# Patient Record
Sex: Female | Born: 1965 | Race: White | Hispanic: No | Marital: Married | Smoking: Former smoker
Health system: Southern US, Community
[De-identification: ages and names within clinical notes are randomized; demographics above are authoritative.]

## PROBLEM LIST (undated history)

## (undated) DIAGNOSIS — M797 Fibromyalgia: Secondary | ICD-10-CM

## (undated) DIAGNOSIS — T7840XA Allergy, unspecified, initial encounter: Secondary | ICD-10-CM

## (undated) DIAGNOSIS — F419 Anxiety disorder, unspecified: Secondary | ICD-10-CM

## (undated) DIAGNOSIS — S62102A Fracture of unspecified carpal bone, left wrist, initial encounter for closed fracture: Secondary | ICD-10-CM

## (undated) DIAGNOSIS — G43909 Migraine, unspecified, not intractable, without status migrainosus: Secondary | ICD-10-CM

## (undated) DIAGNOSIS — F32A Depression, unspecified: Secondary | ICD-10-CM

## (undated) DIAGNOSIS — F329 Major depressive disorder, single episode, unspecified: Secondary | ICD-10-CM

## (undated) HISTORY — DX: Depression, unspecified: F32.A

## (undated) HISTORY — DX: Migraine, unspecified, not intractable, without status migrainosus: G43.909

## (undated) HISTORY — DX: Fibromyalgia: M79.7

## (undated) HISTORY — DX: Major depressive disorder, single episode, unspecified: F32.9

## (undated) HISTORY — DX: Anxiety disorder, unspecified: F41.9

## (undated) HISTORY — DX: Fracture of unspecified carpal bone, left wrist, initial encounter for closed fracture: S62.102A

## (undated) HISTORY — DX: Allergy, unspecified, initial encounter: T78.40XA

## (undated) HISTORY — PX: APPENDECTOMY: SHX54

---

## 2002-03-15 HISTORY — PX: LEFT OOPHORECTOMY: SHX1961

## 2009-02-09 ENCOUNTER — Ambulatory Visit: Payer: Self-pay | Admitting: Internal Medicine

## 2009-02-12 ENCOUNTER — Ambulatory Visit: Payer: Self-pay | Admitting: Internal Medicine

## 2009-04-14 ENCOUNTER — Ambulatory Visit: Payer: Self-pay | Admitting: Internal Medicine

## 2009-05-12 ENCOUNTER — Ambulatory Visit: Payer: Self-pay | Admitting: Family Medicine

## 2009-11-14 ENCOUNTER — Ambulatory Visit: Payer: Self-pay | Admitting: Family Medicine

## 2010-03-19 ENCOUNTER — Ambulatory Visit: Payer: Self-pay | Admitting: Family Medicine

## 2012-12-06 ENCOUNTER — Ambulatory Visit: Payer: Self-pay | Admitting: Family Medicine

## 2012-12-06 LAB — HM MAMMOGRAPHY

## 2013-05-24 ENCOUNTER — Emergency Department: Payer: Self-pay | Admitting: Emergency Medicine

## 2013-05-24 LAB — URINALYSIS, COMPLETE
Bacteria: NONE SEEN
Bilirubin,UR: NEGATIVE
Glucose,UR: NEGATIVE mg/dL (ref 0–75)
Ketone: NEGATIVE
Leukocyte Esterase: NEGATIVE
Nitrite: NEGATIVE
PROTEIN: NEGATIVE
Ph: 6 (ref 4.5–8.0)
RBC,UR: 3 /HPF (ref 0–5)
Specific Gravity: 1.005 (ref 1.003–1.030)
WBC UR: <1 /HPF (ref 0–5)

## 2013-05-24 LAB — COMPREHENSIVE METABOLIC PANEL
ALK PHOS: 69 U/L
ANION GAP: 4 — AB (ref 7–16)
Albumin: 4.1 g/dL (ref 3.4–5.0)
BUN: 13 mg/dL (ref 7–18)
Bilirubin,Total: 0.3 mg/dL (ref 0.2–1.0)
CALCIUM: 9.2 mg/dL (ref 8.5–10.1)
Chloride: 106 mmol/L (ref 98–107)
Co2: 27 mmol/L (ref 21–32)
Creatinine: 0.83 mg/dL (ref 0.60–1.30)
EGFR (African American): >60
EGFR (Non-African Amer.): >60
GLUCOSE: 103 mg/dL — AB (ref 65–99)
Osmolality: 274 (ref 275–301)
Potassium: 3.8 mmol/L (ref 3.5–5.1)
SGOT(AST): 33 U/L (ref 15–37)
SGPT (ALT): 24 U/L (ref 12–78)
Sodium: 137 mmol/L (ref 136–145)
Total Protein: 7.8 g/dL (ref 6.4–8.2)

## 2013-05-24 LAB — CBC
HCT: 38 % (ref 35.0–47.0)
HGB: 12.5 g/dL (ref 12.0–16.0)
MCH: 30.3 pg (ref 26.0–34.0)
MCHC: 32.8 g/dL (ref 32.0–36.0)
MCV: 92 fL (ref 80–100)
PLATELETS: 277 10*3/uL (ref 150–440)
RBC: 4.11 10*6/uL (ref 3.80–5.20)
RDW: 14.2 % (ref 11.5–14.5)
WBC: 7.7 10*3/uL (ref 3.6–11.0)

## 2013-05-24 LAB — LIPASE, BLOOD: Lipase: 95 U/L (ref 73–393)

## 2013-07-02 ENCOUNTER — Ambulatory Visit: Payer: Self-pay | Admitting: Physician Assistant

## 2014-08-19 ENCOUNTER — Other Ambulatory Visit: Payer: Self-pay | Admitting: Unknown Physician Specialty

## 2014-08-19 MED ORDER — OXYCODONE-ACETAMINOPHEN 5-325 MG PO TABS: 1.0000 | ORAL_TABLET | Freq: Three times a day (TID) | ORAL | Status: DC | PRN

## 2014-09-06 ENCOUNTER — Other Ambulatory Visit: Payer: Self-pay | Admitting: Unknown Physician Specialty

## 2014-09-06 MED ORDER — OXYCODONE-ACETAMINOPHEN 5-325 MG PO TABS: 1.0000 | ORAL_TABLET | Freq: Three times a day (TID) | ORAL | Status: DC | PRN

## 2014-09-11 ENCOUNTER — Ambulatory Visit: Payer: Self-pay | Admitting: Unknown Physician Specialty

## 2014-09-13 ENCOUNTER — Encounter: Payer: Self-pay | Admitting: Unknown Physician Specialty

## 2014-09-13 ENCOUNTER — Ambulatory Visit (INDEPENDENT_AMBULATORY_CARE_PROVIDER_SITE_OTHER): Payer: Self-pay | Admitting: Unknown Physician Specialty

## 2014-09-13 VITALS — BP 112/62 | HR 66 | Temp 98.1°F | Ht 61.0 in | Wt 91.2 lb

## 2014-09-13 DIAGNOSIS — G8929 Other chronic pain: Secondary | ICD-10-CM

## 2014-09-13 DIAGNOSIS — G4700 Insomnia, unspecified: Secondary | ICD-10-CM

## 2014-09-13 MED ORDER — CLONAZEPAM 1 MG PO TABS
1.0000 mg | ORAL_TABLET | Freq: Every day | ORAL | Status: DC
Start: 2014-09-13 — End: 2014-10-11

## 2014-09-13 NOTE — Assessment & Plan Note (Signed)
Continue with Clonazepam at night

## 2014-09-13 NOTE — Assessment & Plan Note (Addendum)
Stable.  Continue present dose of medications.  She has been on the same dose for 17 years.

## 2014-09-13 NOTE — Progress Notes (Signed)
BP 112/62 mmHg  Pulse 66  Temp(Src) 98.1 F (36.7 C)  Ht  (1.549 m)  Wt 91 lb 3.2 oz (41.368 kg)  BMI 17.24 kg/m2  SpO2 99%  LMP 07/28/2014 (Approximate)   Subjective:    Patient ID: Annette Atkins, female    DOB: March 02, 1966, 49 y.o.   MRN: [id number]  HPI: Annette Atkins is a 49 y.o. female  Chief Complaint  Patient presents with  . Pain   Working with nutritional therapy.  Pt started juicing with raw vegetables and proteins.  Feels it balanced her body out and hot flashes are gone.  Night sweats are gone and sleeps better.  Son has ODD and due to behavioral issues with her son and has a lot of anxiety and panic "and I don't know how to get rid of the Klonopin."  She has been able to stop the Seroquel.      1.  CHRONIC PAIN    Pain not doing well between her shoulder and her back. But "it is tolerable."  She is able to accomplish household tasks as long as not to physical.  She has been on a stable amount of medications for several years now.    Patient requesting refill of pain medication.  Pain control status:  stable   H6 Pt with chronic pain since Novemeber of 2001 when she delivered her youngest child.  She describes low back pain "dead in the center and radiates to the right.  Takes Percocet BID and sometimes TID for managment.  She has tried hot tub, massagem therapy, "air chair," Tens unit,   We have tried adding Cymbalta, Lyrica, and Neurontin.  They make her sick with nausea, headaches and dizzy.   Benefit from narcotic medications:  yes  H6   Interested in weaning off narcotics:  yes  H6   Stool softners/OTC fiber:  no H6   Previous pain specialty evaluation:  yes  H6 Narcotic contract:  yes  H6     Relevant past medical, surgical, family and social history reviewed and updated as indicated. Interim medical history since our last visit reviewed. Allergies and medications reviewed and updated.  Review of Systems  Per HPI unless specifically  indicated above     Objective:    BP 112/62 mmHg  Pulse 66  Temp(Src) 98.1 F (36.7 C)  Ht  (1.549 m)  Wt 91 lb 3.2 oz (41.368 kg)  BMI 17.24 kg/m2  SpO2 99%  LMP 07/28/2014 (Approximate)  Wt Readings from Last 3 Encounters:  09/13/14 91 lb 3.2 oz (41.368 kg)  05/29/14 99 lb (44.906 kg)    Physical Exam  Constitutional: She is oriented to person, place, and time. She appears well-developed and well-nourished. No distress.  HENT:  Head: Normocephalic and atraumatic.  Eyes: Conjunctivae and lids are normal. Right eye exhibits no discharge. Left eye exhibits no discharge. No scleral icterus.  Cardiovascular: Normal rate and regular rhythm.   Pulmonary/Chest: Effort normal. No respiratory distress.  Abdominal: Normal appearance and bowel sounds are normal. She exhibits no distension. There is no splenomegaly or hepatomegaly. There is no tenderness.  Musculoskeletal: Normal range of motion.  Neurological: She is alert and oriented to person, place, and time.  Skin: Skin is intact. No rash noted. No pallor.  Psychiatric: She has a normal mood and affect. Her behavior is normal. Judgment and thought content normal.  Nursing note and vitals reviewed.      Assessment & Plan:  Problem List Items Addressed This Visit      Other   Insomnia - Primary    Continue with Clonazepam at night      Chronic pain    Stable.  Continue present dose of medications.  She has been on the same dose for 17 years.        Relevant Medications   clonazePAM (KLONOPIN) 1 MG tablet       Follow up plan: Return in about 3 months (around 12/14/2014).

## 2014-10-11 ENCOUNTER — Other Ambulatory Visit: Payer: Self-pay | Admitting: Unknown Physician Specialty

## 2014-10-11 MED ORDER — CLONAZEPAM 1 MG PO TABS: 1.0000 mg | ORAL_TABLET | Freq: Every day | ORAL | Status: DC

## 2014-10-11 MED ORDER — OXYCODONE-ACETAMINOPHEN 5-325 MG PO TABS: 1.0000 | ORAL_TABLET | Freq: Three times a day (TID) | ORAL | Status: DC | PRN

## 2014-10-14 ENCOUNTER — Other Ambulatory Visit: Payer: Self-pay | Admitting: Unknown Physician Specialty

## 2014-11-06 ENCOUNTER — Telehealth: Payer: Self-pay | Admitting: Unknown Physician Specialty

## 2014-11-06 MED ORDER — SUMATRIPTAN SUCCINATE 100 MG PO TABS: 100.0000 mg | ORAL_TABLET | ORAL | Status: DC | PRN

## 2014-11-06 NOTE — Telephone Encounter (Signed)
Routing to provider. Patient was last seen on 09/13/14. Looking in practice partner, patient was given imitrex back in 2012. Pharmacy is [facility].

## 2014-11-06 NOTE — Telephone Encounter (Signed)
Pt stated she has a migraine wants to know if an RX for Imitrex can be called into the pharmacy for her. Pt stated the current migraine medication she has is too expensive. Pharm is [facility]. Pt has severe migraine and wants to know if this can be sent today, pt has to leave to go out of town tomorrow and has spent the last few days in bed. Thanks.

## 2014-11-06 NOTE — Telephone Encounter (Signed)
Called and let patient know rx was sent to pharmacy. 

## 2014-11-06 NOTE — Telephone Encounter (Signed)
Rx sent to her pharmacy 

## 2014-11-07 ENCOUNTER — Telehealth: Payer: Self-pay

## 2014-11-07 NOTE — Telephone Encounter (Signed)
Patient came in the office yesterday right before our computers went down and asked for me to send a message to [facility] to see if she could be put back on phenergan. Stated she is having trouble with BM's. Looking at practice partner, patient had this medication back in 2014. Practice partner number is [phone] and pharmacy is [place].

## 2014-11-08 ENCOUNTER — Other Ambulatory Visit: Payer: Self-pay | Admitting: Unknown Physician Specialty

## 2014-11-08 MED ORDER — OXYCODONE-ACETAMINOPHEN 5-325 MG PO TABS: 1.0000 | ORAL_TABLET | Freq: Three times a day (TID) | ORAL | Status: DC | PRN

## 2014-11-08 MED ORDER — PROMETHAZINE HCL 25 MG PO TABS: 25.0000 mg | ORAL_TABLET | Freq: Three times a day (TID) | ORAL | Status: DC | PRN

## 2014-11-08 NOTE — Telephone Encounter (Signed)
I don't quite understand.  Phenergan is used for nausea not BMs.  Is she having nausea secondary to constipation?  If so, we should treat that.

## 2014-11-08 NOTE — Telephone Encounter (Signed)
Called and spoke to patient. I confused her with another patient about the constipation, so this patient does not have constipation. She stated she is getting nauseated when she gets a migraine.

## 2014-11-08 NOTE — Telephone Encounter (Signed)
Called and left patient a voicemail to please call me back so we can figure out exactly why she is needing this medication.

## 2014-11-11 NOTE — Telephone Encounter (Signed)
Called and let patient know rx was sent to her pharmacy.

## 2014-11-13 ENCOUNTER — Other Ambulatory Visit: Payer: Self-pay | Admitting: Unknown Physician Specialty

## 2014-11-13 MED ORDER — CLONAZEPAM 1 MG PO TABS: 1.0000 mg | ORAL_TABLET | Freq: Every day | ORAL | Status: DC

## 2014-12-06 ENCOUNTER — Other Ambulatory Visit: Payer: Self-pay | Admitting: Unknown Physician Specialty

## 2014-12-06 MED ORDER — OXYCODONE-ACETAMINOPHEN 5-325 MG PO TABS: 1.0000 | ORAL_TABLET | Freq: Three times a day (TID) | ORAL | Status: DC | PRN

## 2014-12-06 MED ORDER — CLONAZEPAM 1 MG PO TABS: 1.0000 mg | ORAL_TABLET | Freq: Every day | ORAL | Status: DC

## 2014-12-25 ENCOUNTER — Other Ambulatory Visit: Payer: Self-pay | Admitting: Unknown Physician Specialty

## 2014-12-25 MED ORDER — OXYCODONE-ACETAMINOPHEN 5-325 MG PO TABS: 1.0000 | ORAL_TABLET | Freq: Three times a day (TID) | ORAL | Status: DC | PRN

## 2014-12-25 MED ORDER — CLONAZEPAM 1 MG PO TABS: 1.0000 mg | ORAL_TABLET | Freq: Every day | ORAL | Status: DC

## 2015-01-31 ENCOUNTER — Other Ambulatory Visit: Payer: Self-pay | Admitting: Unknown Physician Specialty

## 2015-01-31 MED ORDER — CLONAZEPAM 1 MG PO TABS: 1.0000 mg | ORAL_TABLET | Freq: Every day | ORAL | Status: DC

## 2015-01-31 MED ORDER — OXYCODONE-ACETAMINOPHEN 5-325 MG PO TABS: 1.0000 | ORAL_TABLET | Freq: Three times a day (TID) | ORAL | Status: DC | PRN

## 2015-02-24 ENCOUNTER — Other Ambulatory Visit: Payer: Self-pay | Admitting: Unknown Physician Specialty

## 2015-02-24 DIAGNOSIS — Z79891 Long term (current) use of opiate analgesic: Secondary | ICD-10-CM

## 2015-02-24 MED ORDER — CLONAZEPAM 1 MG PO TABS: 1.0000 mg | ORAL_TABLET | Freq: Every day | ORAL | Status: DC

## 2015-02-24 MED ORDER — OXYCODONE-ACETAMINOPHEN 5-325 MG PO TABS: 1.0000 | ORAL_TABLET | Freq: Three times a day (TID) | ORAL | Status: DC | PRN

## 2015-03-04 ENCOUNTER — Other Ambulatory Visit: Payer: Self-pay

## 2015-03-04 DIAGNOSIS — Z79891 Long term (current) use of opiate analgesic: Secondary | ICD-10-CM

## 2015-03-14 LAB — URINE DRUGS OF ABUSE SCREEN W ALC, ROUTINE (REF LAB)
AMPHETAMINES, URINE: NEGATIVE ng/mL
BARBITURATE QUANT UR: NEGATIVE ng/mL
BENZODIAZEPINE QUANT UR: NEGATIVE ng/mL
Cocaine (Metab.): NEGATIVE ng/mL
ETHANOL U, QUAN: NEGATIVE %
Methadone Screen, Urine: NEGATIVE ng/mL
Opiate Quant, Ur: NEGATIVE ng/mL
PCP Quant, Ur: NEGATIVE ng/mL
Propoxyphene: NEGATIVE ng/mL

## 2015-03-14 LAB — PANEL 799049
CANNABINOID GC/MS UR: POSITIVE — AB
CARBOXY THC GC/MS CONF: 153 ng/mL

## 2015-03-28 ENCOUNTER — Other Ambulatory Visit: Payer: Self-pay | Admitting: Unknown Physician Specialty

## 2015-03-28 MED ORDER — OXYCODONE-ACETAMINOPHEN 5-325 MG PO TABS: 1.0000 | ORAL_TABLET | Freq: Three times a day (TID) | ORAL | Status: DC | PRN

## 2015-03-28 MED ORDER — CLONAZEPAM 1 MG PO TABS: 1.0000 mg | ORAL_TABLET | Freq: Every day | ORAL | Status: DC

## 2015-04-25 ENCOUNTER — Other Ambulatory Visit: Payer: Self-pay | Admitting: Unknown Physician Specialty

## 2015-04-25 MED ORDER — CLONAZEPAM 1 MG PO TABS: 1.0000 mg | ORAL_TABLET | Freq: Every day | ORAL | Status: DC

## 2015-04-25 MED ORDER — OXYCODONE-ACETAMINOPHEN 5-325 MG PO TABS: 1.0000 | ORAL_TABLET | Freq: Three times a day (TID) | ORAL | Status: DC | PRN

## 2015-04-30 ENCOUNTER — Other Ambulatory Visit: Payer: Self-pay | Admitting: Family Medicine

## 2015-04-30 ENCOUNTER — Other Ambulatory Visit: Payer: Self-pay | Admitting: Unknown Physician Specialty

## 2015-04-30 NOTE — Telephone Encounter (Signed)
Your patient 

## 2015-05-07 ENCOUNTER — Other Ambulatory Visit: Payer: Self-pay | Admitting: Unknown Physician Specialty

## 2015-05-07 MED ORDER — CLONAZEPAM 1 MG PO TABS: 1.0000 mg | ORAL_TABLET | Freq: Every day | ORAL | Status: DC

## 2015-05-07 MED ORDER — OXYCODONE-ACETAMINOPHEN 5-325 MG PO TABS: 1.0000 | ORAL_TABLET | Freq: Three times a day (TID) | ORAL | Status: DC | PRN

## 2015-05-21 ENCOUNTER — Telehealth: Payer: Self-pay | Admitting: Unknown Physician Specialty

## 2015-05-21 ENCOUNTER — Ambulatory Visit: Payer: Self-pay | Admitting: Unknown Physician Specialty

## 2015-05-21 NOTE — Telephone Encounter (Signed)
Pt called stated she needs [place] suppositories called in for her as she can not take the pills. Pt stated she vomits and the pills come back up. Please call in suppositories ASAP. Pt has not slept. [facility]. Thanks.

## 2015-05-21 NOTE — Telephone Encounter (Signed)
Pt called to reschedule, stating she was in a lot of pain and had been up all night vomiting. Thanks.

## 2015-05-21 NOTE — Telephone Encounter (Signed)
Routing to provider. Patient had appointment today but moved it to 06/02/15.

## 2015-05-29 ENCOUNTER — Other Ambulatory Visit: Payer: Self-pay | Admitting: Family Medicine

## 2015-05-29 MED ORDER — PROMETHAZINE HCL 25 MG RE SUPP: 25.0000 mg | Freq: Four times a day (QID) | RECTAL | Status: DC | PRN

## 2015-06-02 ENCOUNTER — Ambulatory Visit (INDEPENDENT_AMBULATORY_CARE_PROVIDER_SITE_OTHER): Payer: Self-pay | Admitting: Unknown Physician Specialty

## 2015-06-02 ENCOUNTER — Encounter: Payer: Self-pay | Admitting: Unknown Physician Specialty

## 2015-06-02 VITALS — BP 106/67 | HR 80 | Temp 98.0°F | Ht 60.0 in | Wt 93.4 lb

## 2015-06-02 DIAGNOSIS — K900 Celiac disease: Secondary | ICD-10-CM | POA: Insufficient documentation

## 2015-06-02 DIAGNOSIS — G4700 Insomnia, unspecified: Secondary | ICD-10-CM

## 2015-06-02 DIAGNOSIS — G43909 Migraine, unspecified, not intractable, without status migrainosus: Secondary | ICD-10-CM | POA: Insufficient documentation

## 2015-06-02 DIAGNOSIS — G8929 Other chronic pain: Secondary | ICD-10-CM

## 2015-06-02 DIAGNOSIS — G43009 Migraine without aura, not intractable, without status migrainosus: Secondary | ICD-10-CM

## 2015-06-02 DIAGNOSIS — F410 Panic disorder [episodic paroxysmal anxiety] without agoraphobia: Secondary | ICD-10-CM

## 2015-06-02 NOTE — Progress Notes (Signed)
++  BP 106/67 mmHg  Pulse 80  Temp(Src) 98 F (36.7 C)  Ht 5' (1.524 m)  Wt 93 lb 6.4 oz (42.366 kg)  BMI 18.24 kg/m2  SpO2 96%  LMP  (LMP Unknown)   Subjective:    Patient ID: Annette Atkins, female    DOB: 08/30/1965, 50 y.o.   MRN: [id number]  HPI: Annette Atkins is a 50 y.o. female  Chief Complaint  Patient presents with  . Depression   Pt states she is doing well. With current medications and supplements.    Pain management Taking oxycodone BID for about 16 years.  She takes a drug holiday for one week twice a year.  She states if she did not have the pain medications she would not be able to do anything around the house and "would not have a life."  Without it she would need someone to dress her.  Discussed positive drug screen for marijuana.  States she takes a small amount steamed in the PM.  She also takes the Clonazepam nightly.  States it keeps her panic and her anxiety attack "at bay."    Celiac Strict diet adherence.  No B12 levels for a period of time.  Takes B12 and Vitamin D along with other supplements.    Migraines "It has been a bad 2 weeks.  I lost my aunt."  States she finds that traveling on her own is difficult.    Depression screen PHQ 2/9 06/02/2015  Decreased Interest 0  Down, Depressed, Hopeless 0  PHQ - 2 Score 0      Relevant past medical, surgical, family and social history reviewed and updated as indicated. Interim medical history since our last visit reviewed. Allergies and medications reviewed and updated.  Review of Systems  Musculoskeletal:       Left middle finger drop due to cut by a tin can that she never got repaired.      Per HPI unless specifically indicated above     Objective:    BP 106/67 mmHg  Pulse 80  Temp(Src) 98 F (36.7 C)  Ht 5' (1.524 m)  Wt 93 lb 6.4 oz (42.366 kg)  BMI 18.24 kg/m2  SpO2 96%  LMP  (LMP Unknown)  Wt Readings from Last 3 Encounters:  06/02/15 93 lb 6.4 oz (42.366 kg)   09/13/14 91 lb 3.2 oz (41.368 kg)  05/29/14 99 lb (44.906 kg)    Physical Exam  Constitutional: She is oriented to person, place, and time. She appears well-developed and well-nourished. No distress.  HENT:  Head: Normocephalic and atraumatic.  Eyes: Conjunctivae and lids are normal. Right eye exhibits no discharge. Left eye exhibits no discharge. No scleral icterus.  Neck: Normal range of motion. Neck supple. No JVD present. Carotid bruit is not present.  Cardiovascular: Normal rate, regular rhythm and normal heart sounds.   Pulmonary/Chest: Effort normal and breath sounds normal.  Abdominal: Normal appearance. There is no splenomegaly or hepatomegaly.  Musculoskeletal: Normal range of motion.  Neurological: She is alert and oriented to person, place, and time.  Skin: Skin is warm, dry and intact. No rash noted. No pallor.  Psychiatric: She has a normal mood and affect. Her behavior is normal. Judgment and thought content normal.    Results for orders placed or performed in visit on 05/21/15  HM MAMMOGRAPHY  Result Value Ref Range   HM Mammogram from PP       Assessment & Plan:   Problem List Items  Addressed This Visit      Unprioritized   Insomnia   Chronic pain   Panic - Primary   Migraine   Relevant Orders   Comprehensive metabolic panel   CBC   Vitamin B12   Celiac disease   Relevant Orders   Comprehensive metabolic panel   CBC   Vitamin B12      Diagnosis are stable.  There is a concern about poly pharmacy but she has been on a stable doses.  Labs of CBC, CMP B12 when she gets insurance.    Follow up plan: Return in about 6 months (around 12/03/2015).

## 2015-06-20 ENCOUNTER — Other Ambulatory Visit: Payer: Self-pay | Admitting: Unknown Physician Specialty

## 2015-06-20 MED ORDER — OXYCODONE-ACETAMINOPHEN 5-325 MG PO TABS: 1.0000 | ORAL_TABLET | Freq: Three times a day (TID) | ORAL | Status: DC | PRN

## 2015-06-23 ENCOUNTER — Other Ambulatory Visit: Payer: Self-pay | Admitting: Family Medicine

## 2015-06-23 MED ORDER — CLONAZEPAM 1 MG PO TABS: 1.0000 mg | ORAL_TABLET | Freq: Every day | ORAL | Status: DC

## 2015-06-25 ENCOUNTER — Ambulatory Visit: Payer: Self-pay | Admitting: Family Medicine

## 2015-06-26 ENCOUNTER — Ambulatory Visit: Payer: Self-pay | Admitting: Family Medicine

## 2015-07-15 ENCOUNTER — Other Ambulatory Visit: Payer: Self-pay | Admitting: Unknown Physician Specialty

## 2015-07-15 MED ORDER — CLONAZEPAM 1 MG PO TABS: 1.0000 mg | ORAL_TABLET | Freq: Every day | ORAL | Status: DC

## 2015-07-15 MED ORDER — OXYCODONE-ACETAMINOPHEN 5-325 MG PO TABS: 1.0000 | ORAL_TABLET | Freq: Three times a day (TID) | ORAL | Status: DC | PRN

## 2015-08-12 ENCOUNTER — Other Ambulatory Visit: Payer: Self-pay | Admitting: Unknown Physician Specialty

## 2015-08-12 MED ORDER — OXYCODONE-ACETAMINOPHEN 5-325 MG PO TABS: 1.0000 | ORAL_TABLET | Freq: Three times a day (TID) | ORAL | Status: DC | PRN

## 2015-08-12 MED ORDER — CLONAZEPAM 1 MG PO TABS: 1.0000 mg | ORAL_TABLET | Freq: Every day | ORAL | Status: DC

## 2015-09-04 ENCOUNTER — Telehealth: Payer: Self-pay | Admitting: Family Medicine

## 2015-09-04 MED ORDER — QUETIAPINE FUMARATE 50 MG PO TABS: 50.0000 mg | ORAL_TABLET | Freq: Every day | ORAL | Status: DC

## 2015-09-04 NOTE — Telephone Encounter (Signed)
Pt called, convulsively sobbing, nephew was killed in a head on collusion days after his graduation.  She has not slept for several days, has already taken klonopin which helps her be less anxious but does not help her sleep.  Can something be called in?  Would also like Elnita MaxwellCheryl to call her tomorrow [birth year], (also pt's birthday).

## 2015-09-04 NOTE — Telephone Encounter (Signed)
Already on klonopin, no better with 2 pills. Has been on seroquel in the past. Will refill her seroquel for sleep. Cheryl, she'd like it if you could please give her a call tomorrow. Thanks!

## 2015-09-05 ENCOUNTER — Other Ambulatory Visit: Payer: Self-pay | Admitting: Unknown Physician Specialty

## 2015-09-05 ENCOUNTER — Other Ambulatory Visit: Payer: Self-pay

## 2015-09-05 MED ORDER — OXYCODONE-ACETAMINOPHEN 5-325 MG PO TABS: 1.0000 | ORAL_TABLET | Freq: Three times a day (TID) | ORAL | Status: DC | PRN

## 2015-09-05 MED ORDER — SUMATRIPTAN SUCCINATE 100 MG PO TABS: 100.0000 mg | ORAL_TABLET | Freq: Once | ORAL | Status: DC

## 2015-09-05 MED ORDER — CLONAZEPAM 1 MG PO TABS: 1.0000 mg | ORAL_TABLET | Freq: Every day | ORAL | Status: DC

## 2015-09-05 NOTE — Telephone Encounter (Signed)
Called and left a message.

## 2015-10-08 ENCOUNTER — Other Ambulatory Visit: Payer: Self-pay | Admitting: Unknown Physician Specialty

## 2015-10-08 MED ORDER — OXYCODONE-ACETAMINOPHEN 5-325 MG PO TABS: 1.0000 | ORAL_TABLET | Freq: Three times a day (TID) | ORAL | 0 refills | Status: DC | PRN

## 2015-10-08 MED ORDER — CLONAZEPAM 1 MG PO TABS: 1.0000 mg | ORAL_TABLET | Freq: Every day | ORAL | 0 refills | Status: DC

## 2015-10-17 ENCOUNTER — Other Ambulatory Visit: Payer: Self-pay | Admitting: Unknown Physician Specialty

## 2015-10-17 MED ORDER — OXYCODONE-ACETAMINOPHEN 5-325 MG PO TABS: 1.0000 | ORAL_TABLET | Freq: Three times a day (TID) | ORAL | 0 refills | Status: DC | PRN

## 2015-10-17 MED ORDER — CLONAZEPAM 1 MG PO TABS: 1.0000 mg | ORAL_TABLET | Freq: Every day | ORAL | 0 refills | Status: DC

## 2015-11-07 ENCOUNTER — Other Ambulatory Visit: Payer: Self-pay | Admitting: Unknown Physician Specialty

## 2015-11-07 NOTE — Telephone Encounter (Signed)
Your patient 

## 2015-11-27 ENCOUNTER — Other Ambulatory Visit: Payer: Self-pay | Admitting: Unknown Physician Specialty

## 2015-11-27 MED ORDER — CLONAZEPAM 1 MG PO TABS: 1.0000 mg | ORAL_TABLET | Freq: Every day | ORAL | 0 refills | Status: DC

## 2015-11-27 MED ORDER — OXYCODONE-ACETAMINOPHEN 5-325 MG PO TABS: 1.0000 | ORAL_TABLET | Freq: Three times a day (TID) | ORAL | 0 refills | Status: DC | PRN

## 2015-12-08 ENCOUNTER — Ambulatory Visit: Payer: Self-pay | Admitting: Unknown Physician Specialty

## 2015-12-19 ENCOUNTER — Encounter: Payer: Self-pay | Admitting: Unknown Physician Specialty

## 2015-12-19 ENCOUNTER — Ambulatory Visit (INDEPENDENT_AMBULATORY_CARE_PROVIDER_SITE_OTHER): Payer: Self-pay | Admitting: Unknown Physician Specialty

## 2015-12-19 DIAGNOSIS — K900 Celiac disease: Secondary | ICD-10-CM

## 2015-12-19 DIAGNOSIS — G894 Chronic pain syndrome: Secondary | ICD-10-CM

## 2015-12-19 MED ORDER — QUETIAPINE FUMARATE 50 MG PO TABS: 50.0000 mg | ORAL_TABLET | Freq: Every day | ORAL | 6 refills | Status: DC

## 2015-12-19 NOTE — Assessment & Plan Note (Signed)
Stable, continue present medications.   

## 2015-12-19 NOTE — Progress Notes (Signed)
   BP 118/75 (BP Location: Left Arm, Patient Position: Sitting, Cuff Size: Small)   Pulse 80   Temp 98.4 F (36.9 C)   Wt 91 lb 12.8 oz (41.6 kg)   SpO2 96%   BMI 17.93 kg/m    Subjective:    Patient ID: Annette Atkins, female    DOB: 02-03-1966, 50 y.o.   MRN: [id number]  HPI: Annette Atkins is a 50 y.o. female  Chief Complaint  Patient presents with  . Anxiety  . Insomnia   Pt states she is just here for her "6 month check-up".  She is having a great deal of problems with multiple family tragedies.   Pain management Taking oxycodone BID for about 16 years.  She takes a drug holiday for one week twice a year.  She states if she did not have the pain medications she would not be able to do anything around the house and "would not have a life."  Without it she would need someone to dress her.  Discussed positive drug screen for marijuana.  States she takes a small amount steamed in the PM.  She also takes the Clonazepam nightly.  States it keeps her panic and her anxiety attack "at bay."    Celiac Strict diet adherence.   Relevant past medical, surgical, family and social history reviewed and updated as indicated. Interim medical history since our last visit reviewed. Allergies and medications reviewed and updated.  Review of Systems  Per HPI unless specifically indicated above     Objective:    BP 118/75 (BP Location: Left Arm, Patient Position: Sitting, Cuff Size: Small)   Pulse 80   Temp 98.4 F (36.9 C)   Wt 91 lb 12.8 oz (41.6 kg)   SpO2 96%   BMI 17.93 kg/m   Wt Readings from Last 3 Encounters:  12/19/15 91 lb 12.8 oz (41.6 kg)  06/02/15 93 lb 6.4 oz (42.4 kg)  09/13/14 91 lb 3.2 oz (41.4 kg)    Physical Exam  Constitutional: She is oriented to person, place, and time. She appears well-developed and well-nourished. No distress.  HENT:  Head: Normocephalic and atraumatic.  Eyes: Conjunctivae and lids are normal. Right eye exhibits no discharge.  Left eye exhibits no discharge. No scleral icterus.  Neck: Normal range of motion. Neck supple. No JVD present. Carotid bruit is not present.  Cardiovascular: Normal rate, regular rhythm and normal heart sounds.   Pulmonary/Chest: Effort normal and breath sounds normal.  Abdominal: Normal appearance. There is no splenomegaly or hepatomegaly.  Musculoskeletal: Normal range of motion.  Neurological: She is alert and oriented to person, place, and time.  Skin: Skin is warm, dry and intact. No rash noted. No pallor.  Psychiatric: She has a normal mood and affect. Her behavior is normal. Judgment and thought content normal.    Results for orders placed or performed in visit on 05/21/15  HM MAMMOGRAPHY  Result Value Ref Range   HM Mammogram from PP       Assessment & Plan:   Problem List Items Addressed This Visit      Unprioritized   Celiac disease    Stable, continue present medications.        Chronic pain    Stable, continue present medications.         Other Visit Diagnoses   None.      Follow up plan: Return in about 6 months (around 06/18/2016).

## 2016-01-05 ENCOUNTER — Other Ambulatory Visit: Payer: Self-pay | Admitting: Unknown Physician Specialty

## 2016-01-05 MED ORDER — CLONAZEPAM 1 MG PO TABS: 1.0000 mg | ORAL_TABLET | Freq: Every day | ORAL | 0 refills | Status: DC

## 2016-01-05 MED ORDER — OXYCODONE-ACETAMINOPHEN 5-325 MG PO TABS: 1.0000 | ORAL_TABLET | Freq: Three times a day (TID) | ORAL | 0 refills | Status: DC | PRN

## 2016-01-06 ENCOUNTER — Other Ambulatory Visit: Payer: Self-pay | Admitting: Unknown Physician Specialty

## 2016-01-28 ENCOUNTER — Other Ambulatory Visit: Payer: Self-pay | Admitting: Unknown Physician Specialty

## 2016-01-28 MED ORDER — CLONAZEPAM 1 MG PO TABS: 1.0000 mg | ORAL_TABLET | Freq: Every day | ORAL | 0 refills | Status: DC

## 2016-01-28 MED ORDER — OXYCODONE-ACETAMINOPHEN 5-325 MG PO TABS: 1.0000 | ORAL_TABLET | Freq: Three times a day (TID) | ORAL | 0 refills | Status: DC | PRN

## 2016-02-27 ENCOUNTER — Other Ambulatory Visit: Payer: Self-pay | Admitting: Unknown Physician Specialty

## 2016-02-27 MED ORDER — CLONAZEPAM 1 MG PO TABS: 1.0000 mg | ORAL_TABLET | Freq: Every day | ORAL | 0 refills | Status: DC

## 2016-02-27 MED ORDER — OXYCODONE-ACETAMINOPHEN 5-325 MG PO TABS: 1.0000 | ORAL_TABLET | Freq: Three times a day (TID) | ORAL | 0 refills | Status: DC | PRN

## 2016-03-29 ENCOUNTER — Other Ambulatory Visit: Payer: Self-pay

## 2016-03-29 MED ORDER — CLONAZEPAM 1 MG PO TABS: 1.0000 mg | ORAL_TABLET | Freq: Every day | ORAL | 0 refills | Status: DC

## 2016-03-29 MED ORDER — OXYCODONE-ACETAMINOPHEN 5-325 MG PO TABS: 1.0000 | ORAL_TABLET | Freq: Three times a day (TID) | ORAL | 0 refills | Status: DC | PRN

## 2016-03-29 NOTE — Telephone Encounter (Signed)
Patient here to pick up 28 day prescriptions.  

## 2016-04-20 ENCOUNTER — Other Ambulatory Visit: Payer: Self-pay | Admitting: Unknown Physician Specialty

## 2016-04-20 MED ORDER — CLONAZEPAM 1 MG PO TABS: 1.0000 mg | ORAL_TABLET | Freq: Every day | ORAL | 0 refills | Status: DC

## 2016-04-20 MED ORDER — OXYCODONE-ACETAMINOPHEN 5-325 MG PO TABS: 1.0000 | ORAL_TABLET | Freq: Three times a day (TID) | ORAL | 0 refills | Status: DC | PRN

## 2016-04-23 ENCOUNTER — Emergency Department: Payer: Medicaid Other

## 2016-04-23 ENCOUNTER — Emergency Department
Admission: EM | Admit: 2016-04-23 | Discharge: 2016-04-23 | Disposition: A | Discharge status: Home / Self Care | Payer: Medicaid Other | Attending: Emergency Medicine | Admitting: Emergency Medicine

## 2016-04-23 ENCOUNTER — Ambulatory Visit: Payer: Self-pay | Admitting: Unknown Physician Specialty

## 2016-04-23 ENCOUNTER — Ambulatory Visit (INDEPENDENT_AMBULATORY_CARE_PROVIDER_SITE_OTHER): Payer: Self-pay | Admitting: Unknown Physician Specialty

## 2016-04-23 DIAGNOSIS — Z79899 Other long term (current) drug therapy: Secondary | ICD-10-CM | POA: Diagnosis not present

## 2016-04-23 DIAGNOSIS — F17200 Nicotine dependence, unspecified, uncomplicated: Secondary | ICD-10-CM | POA: Diagnosis not present

## 2016-04-23 DIAGNOSIS — F410 Panic disorder [episodic paroxysmal anxiety] without agoraphobia: Secondary | ICD-10-CM | POA: Diagnosis not present

## 2016-04-23 DIAGNOSIS — F419 Anxiety disorder, unspecified: Secondary | ICD-10-CM | POA: Insufficient documentation

## 2016-04-23 LAB — COMPREHENSIVE METABOLIC PANEL
ALBUMIN: 4.6 g/dL (ref 3.5–5.0)
ALK PHOS: 67 U/L (ref 38–126)
ALT: 16 U/L (ref 14–54)
ANION GAP: 7 (ref 5–15)
AST: 27 U/L (ref 15–41)
BUN: 12 mg/dL (ref 6–20)
CO2: 27 mmol/L (ref 22–32)
CREATININE: 1 mg/dL (ref 0.44–1.00)
Calcium: 9.8 mg/dL (ref 8.9–10.3)
Chloride: 104 mmol/L (ref 101–111)
GFR calc Af Amer: >60 mL/min (ref >60)
GFR calc non Af Amer: >60 mL/min (ref >60)
GLUCOSE: 91 mg/dL (ref 65–99)
Potassium: 3.5 mmol/L (ref 3.5–5.1)
SODIUM: 138 mmol/L (ref 135–145)
Total Bilirubin: 0.6 mg/dL (ref 0.3–1.2)
Total Protein: 7.2 g/dL (ref 6.5–8.1)

## 2016-04-23 LAB — SALICYLATE LEVEL: Salicylate Lvl: <7 mg/dL (ref 2.8–30.0)

## 2016-04-23 LAB — CBC
HEMATOCRIT: 36.2 % (ref 35.0–47.0)
HEMOGLOBIN: 12.5 g/dL (ref 12.0–16.0)
MCH: 31.1 pg (ref 26.0–34.0)
MCHC: 34.5 g/dL (ref 32.0–36.0)
MCV: 90.2 fL (ref 80.0–100.0)
Platelets: 250 10*3/uL (ref 150–440)
RBC: 4.02 MIL/uL (ref 3.80–5.20)
RDW: 13.9 % (ref 11.5–14.5)
WBC: 6.7 10*3/uL (ref 3.6–11.0)

## 2016-04-23 LAB — ACETAMINOPHEN LEVEL

## 2016-04-23 LAB — ETHANOL: Alcohol, Ethyl (B): <5 mg/dL (ref <5)

## 2016-04-23 LAB — TROPONIN I: Troponin I: <0.03 ng/mL (ref <0.03)

## 2016-04-23 MED ORDER — LORAZEPAM 2 MG/ML IJ SOLN
0.5000 mg | Freq: Once | INTRAMUSCULAR | Status: AC
  Administered 2016-04-23: 0.5 mg via INTRAVENOUS
  Filled 2016-04-23: qty 1

## 2016-04-23 MED ORDER — CLONAZEPAM 1 MG PO TABS: ORAL_TABLET | ORAL | 0 refills | Status: DC

## 2016-04-23 MED ORDER — CYCLOBENZAPRINE HCL 10 MG PO TABS
10.0000 mg | ORAL_TABLET | Freq: Once | ORAL | Status: AC
  Administered 2016-04-23: 10 mg via ORAL
  Filled 2016-04-23: qty 1

## 2016-04-23 MED ORDER — IBUPROFEN 400 MG PO TABS
400.0000 mg | ORAL_TABLET | Freq: Once | ORAL | Status: AC
  Administered 2016-04-23: 400 mg via ORAL
  Filled 2016-04-23: qty 1

## 2016-04-23 NOTE — Assessment & Plan Note (Signed)
Pt begged to "make it stop" Unable to treat for acute behavioral issues, pt referred to [facility] ER.  Rescue called as she is unable to transport herself.  Husband called.

## 2016-04-23 NOTE — Discharge Instructions (Signed)
Return to the emergency department for any new or worsening symptoms including chest pain or nausea with sweats or trouble breathing or shortness of breath or fevers. Return for dizziness or passing out, or any other symptoms concerning to you.  Please strongly consider following up with a counselor or psychiatrist for help and guidance through period of stress/panic based on your history.

## 2016-04-23 NOTE — ED Notes (Signed)
Pt calling for ride

## 2016-04-23 NOTE — ED Notes (Signed)
Dr.Clapacs at bedside  

## 2016-04-23 NOTE — ED Triage Notes (Signed)
Pt came to ED, reports has been going through stress and feels like she is having a nervous breakdown. Originally went doctor's office this morning, per EMS pt threw herself on ground, c/o heart racing. VS stable.

## 2016-04-23 NOTE — ED Provider Notes (Signed)
[facility] Emergency Department Provider Note ____________________________________________   I have reviewed the triage vital signs and the triage nursing note.  HISTORY  Chief Complaint Anxiety   Historian Patient  HPI Annette Atkins is a 51 y.o. female who is brought in by EMS from her PCPs office complaining of "I think I'm having a nervous breakdown" and chest pain. EMS reported that it was the impression of the office staff the patient may be having a panic attack, but given the chest pain, they certainly one her to be fully evaluated.  Patient herself states to me that she has been under an extreme amount of stress with multiple deaths of left ones that she's had to deal with, either as a caretaker, or immediate members of her family, as well as stress due to her children and her spouse. 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 She states that she thinks she is having panic and anxiety and she just wants her heart racing to stop. She states that her heart has been pounding heavily for days if not weeks.  Denies any suicidal or homicidal thoughts. States that she saw a counselor in the past, but not recently. States that she takes a half pill of clonazepam twice a day, but she really would prefer to get off medications altogether.   She is voluntarily requesting to speak with a counselor or psychiatrist.    Past Medical History:  Diagnosis Date  . Allergy   . Anxiety   . Depression   . Fibromyalgia   . Migraine     Patient Active Problem List   Diagnosis Date Noted  . Panic attacks 06/02/2015  . Migraine 06/02/2015  . Celiac disease 06/02/2015  . Insomnia 09/13/2014  . Chronic pain 09/13/2014    Past Surgical History:  Procedure Laterality Date  . APPENDECTOMY  Age 51  . CESAREAN SECTION  2000 and 2001  . LEFT  OOPHORECTOMY Left 2004    Prior to Admission medications   Medication Sig Start Date End Date Taking? Authorizing Provider  clonazePAM (KLONOPIN) 1 MG tablet 1/2 tab twice per day as needed for anxiety/panic attack 04/23/16   Governor Rooksebecca Cherysh Epperly, MD  oxyCODONE-acetaminophen (ROXICET) 5-325 MG tablet Take 1 tablet by mouth every 8 (eight) hours as needed for severe pain. 04/20/16   Gabriel Cirriheryl Wicker, NP  promethazine (PHENERGAN) 25 MG suppository Place 1 suppository (25 mg total) rectally every 6 (six) hours as needed for nausea or vomiting. 05/29/15   Megan P Johnson, DO  QUEtiapine (SEROQUEL) 50 MG tablet Take 1 tablet (50 mg total) by mouth at bedtime. 12/19/15   Gabriel Cirriheryl Wicker, NP  SUMAtriptan (IMITREX) 100 MG tablet Take 1 tablet (100 mg total) by mouth once. May repeat in 2 hours if headache persists or recurs. 01/06/16 01/06/16  Gabriel Cirriheryl Wicker, NP    Allergies  Allergen Reactions  . Codeine   . Vicodin [Hydrocodone-Acetaminophen] Other (See Comments)    dizzy    Family History  Problem Relation Age of Onset  . Arthritis Mother   . Glaucoma Mother   . Thyroid disease Mother   . Heart disease Father   . Cancer Paternal Grandmother     pancreatic  . Mental illness Son     Social History Social History  Substance Use Topics  . Smoking status: Current Some Day Smoker  .  Smokeless tobacco: Never Used  . Alcohol use 0.0 oz/week     Comment: glass of wine on accasion    Review of Systems  Constitutional: Negative for fever. Eyes: Negative for visual changes. ENT: Negative for sore throat. Cardiovascular: Heart pounding, central chest pressure today. Respiratory: Negative for coughing. Gastrointestinal: Negative for abdominal pain, vomiting and diarrhea. Genitourinary: Negative for dysuria. Musculoskeletal: Negative for back pain. Skin: Negative for rash. Neurological: History of migraines, currently no headache. 10 point Review of Systems otherwise  negative ____________________________________________   PHYSICAL EXAM:  VITAL SIGNS: ED Triage Vitals [04/23/16 1225]  Enc Vitals Group     BP 92/65     Pulse Rate 74     Resp 16     Temp 98.7 F (37.1 C)     Temp Source Oral     SpO2 98 %     Weight      Height      Head Circumference      Peak Flow      Pain Score      Pain Loc      Pain Edu?      Excl. in GC?      Constitutional: Alert and oriented, but very anxious and panicky. Tearful but able to focus and give good history. HEENT   Head: Normocephalic and atraumatic.      Eyes: Conjunctivae are normal. PERRL. Normal extraocular movements.      Ears:         Nose: No congestion/rhinnorhea.   Mouth/Throat: Mucous membranes are moist.   Neck: No stridor. Cardiovascular/Chest: Normal rate, regular rhythm.  No murmurs, rubs, or gallops. Respiratory: Normal respiratory effort without tachypnea nor retractions. Breath sounds are clear and equal bilaterally. No wheezes/rales/rhonchi. Gastrointestinal: Soft. No distention, no guarding, no rebound. Nontender.    Genitourinary/rectal:Deferred Musculoskeletal: Nontender with normal range of motion in all extremities. No joint effusions.  No lower extremity tenderness.  No edema. Neurologic:  Normal speech and language. No gross or focal neurologic deficits are appreciated. Skin:  Skin is warm, dry and intact. No rash noted. Psychiatric:  Anxious and appears to have waves of grief, or traumatic thoughts that cause her to cry. However speech and behavior are normal.  No hallucinations. Patient exhibits appropriate insight and judgment.  No suicidal or homicidal ideation.   ____________________________________________  LABS (pertinent positives/negatives)  Labs Reviewed  ACETAMINOPHEN LEVEL - Abnormal; Notable for the following:       Result Value   Acetaminophen (Tylenol), Serum <10 (*)    All other components within normal limits  COMPREHENSIVE METABOLIC PANEL   ETHANOL  SALICYLATE LEVEL  CBC  TROPONIN I  URINE DRUG SCREEN, QUALITATIVE ([facility] ONLY)    ____________________________________________    EKG I, Governor Rooks, MD, the attending physician have personally viewed and interpreted all ECGs.  59 bpm. Normal sinus. Narrow QRS. Normal axis. Normal ST and T-wave ____________________________________________  RADIOLOGY All Xrays were viewed by me. Imaging interpreted by Radiologist.  Chest xray: Normal chest __________________________________________  PROCEDURES  Procedure(s) performed: None  Critical Care performed: None  ____________________________________________   ED COURSE / ASSESSMENT AND PLAN  Pertinent labs & imaging results that were available during my care of the patient were reviewed by me and considered in my medical decision making (see chart for details).   Ms. Howes appears to be having a panic attack. She seems very focused and attentive in terms of understanding that she is having cognitive grief/traumatic thought patterns. No abnormal  behavior, hallucinations, or suicidal or homicidal ideation. No criteria for emergency psychiatric commitment. However, she and I both feel she would benefit from speaking with counselor/TTS as well as psychiatrist and I did place these consults.  In terms of the chest discomfort and sensation of pounding, had a low suspicion for emergency cardiac or pulmonary cause, however will obtain EKG and laboratory studies as well as chest x-ray.   Cardiac evaluation reassuring. Ongoing symptoms, do not feel repeat troponin is warranted.  Patient did speak with TTS and received outpatient resources. She spoke with Dr. Toni Amend and states that she was not connecting with him at all, and is not interested in taking any SSRI or other type medication. She states that she ran out of her clonazepam, due for her refill in 3 days, on Monday.  I spoke by phone with Gabriel Cirri, her primary  provider who states that she is not comfortable moving forward treating her behavioral health issues/panic attacks. She does however have her refill prescription ready for pickup as due on 04/26/16, in 3 days.  I reviewed did not contact her database and it appears to me like she is receiving medications as scheduled from one provider. I am going to give her 3 days dose for coverage over the weekend.  Okay for discharge from the emergency department this point in time. She is encouraged to follow up with a behavioral health provider for ongoing behavioral health management    CONSULTATIONS:  TTS and psychiatry.   Patient / Family / Caregiver informed of clinical course, medical decision-making process, and agree with plan.   I discussed return precautions, follow-up instructions, and discharge instructions with patient and/or family.   ___________________________________________   FINAL CLINICAL IMPRESSION(S) / ED DIAGNOSES   Final diagnoses:  Anxiety attack              Note: This dictation was prepared with Dragon dictation. Any transcriptional errors that result from this process are unintentional    Governor Rooks, MD 04/23/16 [phone]

## 2016-04-23 NOTE — ED Notes (Signed)
Patient requesting hallway bed instead of placement in quad. Patient refusing to dress out.

## 2016-04-23 NOTE — BH Assessment (Signed)
Discussed patient with ER MD (Dr. Shaune PollackLord) and patient is able to discharge home when medically cleared. Patient was giving referral information and instructions on how to follow up with Outpatient Treatment ([facility]) and [facility].  Patient denies SI/HI and AV/H.

## 2016-04-23 NOTE — Consult Note (Signed)
[place] Psychiatry Consult   Reason for Consult:  Consult for 51 year old woman who presented voluntarily to the emergency room with anxiety symptoms Referring Physician:  Reita Cliche Patient Identification: Annette Atkins MRN:  [id number] Principal Diagnosis: Panic attacks Diagnosis:   Patient Active Problem List   Diagnosis Date Noted  . Panic attacks [F41.0] 06/02/2015  . Migraine [G43.909] 06/02/2015  . Celiac disease [K90.0] 06/02/2015  . Insomnia [G47.00] 09/13/2014  . Chronic pain [G89.29] 09/13/2014    Total Time spent with patient: 1 hour  Subjective:   Annette Atkins is a 51 y.o. female patient admitted with "the panic attacks are getting to be too much".  HPI:  Patient interviewed. Chart reviewed. This 51 year old woman has a history of anxiety symptoms. Today she presented to her primary care doctor's office in a state of extreme anxiety. They referred her to come and be seen in the emergency room. By the time I see her the patient has calm down. She describes her symptoms of her anxiety attacks as being extreme anxiety which now is also accompanied by chest pain and numbness on the left side. The symptoms are getting to be frightening for her. She says that over the past 2-3 weeks the frequency and intensity of her attacks has increased dramatically. She estimates that she now has 8 or 9 of these attacks a day which can last up to a hour at a time. She says she does not know of anything she can think of that is triggering them. She denies being depressed. Denies hopelessness. Denies any suicidal thoughts. She reports having chronic severe stress related to multiple family issues many of which revolve around her 53 year old son who has some chronic behavioral problems. There have also been multiple traumatic losses of family members over the last months to year. Patient is currently prescribed clonazepam 1.0 mg total per day. She takes this by breaking the pill in half  and taking 0.5 mg twice a day. Also takes some chronic narcotic medication for chronic pain related to a back injury from childbirth.  Social history: Married. Works in [employer]. Apparently just getting some kind of transition in her work because she does not have insurance other than Medicaid right now. She has a 37 year old son who has chronic behavior problems as well as other children.  Medical history: Chronic low back pain.  Substance abuse history: Patient states that she dislikes alcohol strongly and drinks no alcohol at all. Denies any other drug abuse. There is an episode of a positive drug screen for cannabis on one previous occasion. No drug screen is back from this visit to the emergency room.  Past Psychiatric History: No history of psychiatric hospitalization no history of suicide attempts no history of psychosis. Patient has seen a psychotherapist years ago for traumatic stress which she found helpful but is not seeing anyone currently. She is taking the clonazepam as noted above. She has been prescribed serotonin reuptake inhibitors in the past but refused to take them. She tells me that she would refuse to get involved with taking any other medication.  Risk to Self: Is patient at risk for suicide?: No Risk to Others:   Prior Inpatient Therapy:   Prior Outpatient Therapy:    Past Medical History:  Past Medical History:  Diagnosis Date  . Allergy   . Anxiety   . Depression   . Fibromyalgia   . Migraine     Past Surgical History:  Procedure Laterality Date  .  APPENDECTOMY  Age 22  . CESAREAN SECTION  2000 and 2001  . LEFT OOPHORECTOMY Left 2004   Family History:  Family History  Problem Relation Age of Onset  . Arthritis Mother   . Glaucoma Mother   . Thyroid disease Mother   . Heart disease Father   . Cancer Paternal Grandmother     pancreatic  . Mental illness Son    Family Psychiatric  History: Sounds like her son has behavior problems but  this may be related to a history of severe trauma on his part. No other family history identified Social History:  History  Alcohol Use  . 0.0 oz/week    Comment: glass of wine on accasion     History  Drug Use No    Social History   Social History  . Marital status: Married    Spouse name: N/A  . Number of children: N/A  . Years of education: N/A   Social History Main Topics  . Smoking status: Current Some Day Smoker  . Smokeless tobacco: Never Used  . Alcohol use 0.0 oz/week     Comment: glass of wine on accasion  . Drug use: No  . Sexual activity: Yes   Other Topics Concern  . None   Social History Narrative  . None   Additional Social History:    Allergies:   Allergies  Allergen Reactions  . Codeine   . Vicodin [Hydrocodone-Acetaminophen] Other (See Comments)    dizzy    Labs:  Results for orders placed or performed during the hospital encounter of 04/23/16 (from the past 48 hour(s))  Comprehensive metabolic panel     Status: None   Collection Time: 04/23/16 12:30 PM  Result Value Ref Range   Sodium 138 135 - 145 mmol/L   Potassium 3.5 3.5 - 5.1 mmol/L   Chloride 104 101 - 111 mmol/L   CO2 27 22 - 32 mmol/L   Glucose, Bld 91 65 - 99 mg/dL   BUN 12 6 - 20 mg/dL   Creatinine, Ser 1.00 0.44 - 1.00 mg/dL   Calcium 9.8 8.9 - 10.3 mg/dL   Total Protein 7.2 6.5 - 8.1 g/dL   Albumin 4.6 3.5 - 5.0 g/dL   AST 27 15 - 41 U/L   ALT 16 14 - 54 U/L   Alkaline Phosphatase 67 38 - 126 U/L   Total Bilirubin 0.6 0.3 - 1.2 mg/dL   GFR calc non Af Amer >60 >60 mL/min   GFR calc Af Amer >60 >60 mL/min    Comment: (NOTE) The eGFR has been calculated using the CKD EPI equation. This calculation has not been validated in all clinical situations. eGFR's persistently <60 mL/min signify possible Chronic Kidney Disease.    Anion gap 7 5 - 15  Ethanol     Status: None   Collection Time: 04/23/16 12:30 PM  Result Value Ref Range   Alcohol, Ethyl (B) <5 <5 mg/dL     Comment:        LOWEST DETECTABLE LIMIT FOR SERUM ALCOHOL IS 5 mg/dL FOR MEDICAL PURPOSES ONLY   Salicylate level     Status: None   Collection Time: 04/23/16 12:30 PM  Result Value Ref Range   Salicylate Lvl <2.0 2.8 - 30.0 mg/dL  Acetaminophen level     Status: Abnormal   Collection Time: 04/23/16 12:30 PM  Result Value Ref Range   Acetaminophen (Tylenol), Serum <10 (L) 10 - 30 ug/mL    Comment:  THERAPEUTIC CONCENTRATIONS VARY SIGNIFICANTLY. A RANGE OF 10-30 ug/mL MAY BE AN EFFECTIVE CONCENTRATION FOR MANY PATIENTS. HOWEVER, SOME ARE BEST TREATED AT CONCENTRATIONS OUTSIDE THIS RANGE. ACETAMINOPHEN CONCENTRATIONS >150 ug/mL AT 4 HOURS AFTER INGESTION AND >50 ug/mL AT 12 HOURS AFTER INGESTION ARE OFTEN ASSOCIATED WITH TOXIC REACTIONS.   cbc     Status: None   Collection Time: 04/23/16 12:30 PM  Result Value Ref Range   WBC 6.7 3.6 - 11.0 K/uL   RBC 4.02 3.80 - 5.20 MIL/uL   Hemoglobin 12.5 12.0 - 16.0 g/dL   HCT 36.2 35.0 - 47.0 %   MCV 90.2 80.0 - 100.0 fL   MCH 31.1 26.0 - 34.0 pg   MCHC 34.5 32.0 - 36.0 g/dL   RDW 13.9 11.5 - 14.5 %   Platelets 250 150 - 440 K/uL  Troponin I     Status: None   Collection Time: 04/23/16 12:30 PM  Result Value Ref Range   Troponin I <0.03 <0.03 ng/mL    Current Facility-Administered Medications  Medication Dose Route Frequency Provider Last Rate Last Dose  . cyclobenzaprine (FLEXERIL) tablet 10 mg  10 mg Oral Once Lisa Roca, MD      . ibuprofen (ADVIL,MOTRIN) tablet 400 mg  400 mg Oral Once Lisa Roca, MD       Current Outpatient Prescriptions  Medication Sig Dispense Refill  . clonazePAM (KLONOPIN) 1 MG tablet Take 1 tablet (1 mg total) by mouth daily. 28 tablet 0  . oxyCODONE-acetaminophen (ROXICET) 5-325 MG tablet Take 1 tablet by mouth every 8 (eight) hours as needed for severe pain. 84 tablet 0  . promethazine (PHENERGAN) 25 MG suppository Place 1 suppository (25 mg total) rectally every 6 (six) hours as needed  for nausea or vomiting. 30 each 0  . QUEtiapine (SEROQUEL) 50 MG tablet Take 1 tablet (50 mg total) by mouth at bedtime. 30 tablet 6  . SUMAtriptan (IMITREX) 100 MG tablet Take 1 tablet (100 mg total) by mouth once. May repeat in 2 hours if headache persists or recurs. 9 tablet 0    Musculoskeletal: Strength & Muscle Tone: within normal limits Gait & Station: normal Patient leans: N/A  Psychiatric Specialty Exam: Physical Exam  Nursing note and vitals reviewed. Constitutional: She appears well-developed and well-nourished.  HENT:  Head: Normocephalic and atraumatic.  Eyes: Conjunctivae are normal. Pupils are equal, round, and reactive to light.  Neck: Normal range of motion.  Cardiovascular: Regular rhythm and normal heart sounds.   Respiratory: Effort normal. No respiratory distress.  GI: Soft.  Musculoskeletal: Normal range of motion.  Neurological: She is alert.  Skin: Skin is warm and dry.  Psychiatric: Her speech is normal and behavior is normal. Her affect is blunt. Thought content is not paranoid. Cognition and memory are normal. She expresses impulsivity. She expresses no homicidal and no suicidal ideation.    Review of Systems  Constitutional: Negative.   HENT: Negative.   Eyes: Negative.   Respiratory: Negative.   Cardiovascular: Positive for chest pain.  Gastrointestinal: Negative.   Musculoskeletal: Positive for back pain.  Skin: Negative.   Neurological: Negative.   Psychiatric/Behavioral: Negative for depression, hallucinations, memory loss, substance abuse and suicidal ideas. The patient is nervous/anxious and has insomnia.     Blood pressure 92/65, pulse 74, temperature 98.7 F (37.1 C), temperature source Oral, resp. rate 16, SpO2 98 %.There is no height or weight on file to calculate BMI.  General Appearance: Fairly Groomed  Eye Contact:  Fair  Speech:  Normal Rate  Volume:  Normal  Mood:  Anxious and Irritable  Affect:  Congruent  Thought Process:   Goal Directed  Orientation:  Full (Time, Place, and Person)  Thought Content:  Logical  Suicidal Thoughts:  No  Homicidal Thoughts:  No  Memory:  Immediate;   Good Recent;   Good Remote;   Good  Judgement:  Fair  Insight:  Fair  Psychomotor Activity:  Normal  Concentration:  Concentration: Fair  Recall:  [place] of Knowledge:  Fair  Language:  Fair  Akathisia:  No  Handed:  Right  AIMS (if indicated):     Assets:  Communication Skills Desire for [place] Talents/Skills Vocational/Educational  ADL's:  Intact  Cognition:  WNL  Sleep:        Treatment Plan Summary: Plan 51 year old woman who presents with complaints of increased frequency of panic attacks. Party taking standing benzodiazepines. Patient was educated about appropriate treatment for panic attacks with focus on the use of serotonin reuptake inhibitors for medication, limiting the use of benzodiazepines, and getting involved with cognitive behavioral therapy. We discussed some of the limitations on therapy she may have given that she has Medicaid right now. I mentioned [facility] as a provider in our [place]. Patient reacted very angrily to that. I suggested that she look into therapist at the local [facility] departments of psychology or psychiatry. Patient also states that she should be getting health insurance soon. Once again, the patient absolutely refused to even consider the idea of taking serotonin reuptake inhibitors. She focused instead on wanting me to increase or change her benzodiazepine prescriptions. You with her the limitations of benzodiazepines in treating panic disorder on a daily basis. I suggested however that she should contact her primary care provider since that person is already prescribing her clonazepam. I suggested that if she could not reach [facility] this afternoon and that she could probably safely increase her use of clonazepam to a half  milligram 3 times a day. Patient appeared to find this suggestion unsatisfactory. I do not feel it's appropriate for me to provide any other controlled substances under this situation in the emergency room. Patient does not appear to be dangerous. Does not require hospital level treatment. Does not require IVC. Can be released from the emergency room with referral to outpatient treatment.  Disposition: Patient does not meet criteria for psychiatric inpatient admission. Supportive therapy provided about ongoing stressors. Discussed crisis plan, support from social network, calling 911, coming to the Emergency Department, and calling Suicide Hotline.  Alethia Berthold, MD 04/23/2016 3:03 PM

## 2016-04-23 NOTE — BH Assessment (Signed)
Assessment Note  Annette Atkins is an 51 y.o. female who presents to the ER after been seen by her PCP. According to the patient, she had a panic attack, while in their office. They became concerned and felt it was best if she was seen in the ER. Patient further states, for the last two weeks, she has had an increase in the frequency and the intensity of the attacks. In the past, they were one to two times a week. Now they are taking place, several times throughout the day.  Symptoms includes increase heart rate, sweating, tingling in her hands, shortness of breath and her jaw tightening.  She believes her anxiety has increases due to the most recent things that have taking place in her life. Several weeks ago, she had a meeting at her child's school to go over his IEP. Per her report, the meeting did not go well. The teacher did not know what he was doing, the principal was rude and did not get much accomplished. Prior to that, she has had a great deal of losses, within her family. Within the last two years, she have lost her grandmother, father, an aunt and nephew. When her son was a 52 years old, he was in a bad car wreck and he was hospitalized for three months on the burn unit. She believes the IEP meeting and lack of income, triggered her anxiety.  Patient denies SI/HI and AV/H.  Husband was present during the interview. Writer received permission from the patient, after asking the husband to step out of the room.  Diagnosis: Anxiety  Past Medical History:  Past Medical History:  Diagnosis Date  . Allergy   . Anxiety   . Depression   . Fibromyalgia   . Migraine     Past Surgical History:  Procedure Laterality Date  . APPENDECTOMY  Age 51  . CESAREAN SECTION  2000 and 2001  . LEFT OOPHORECTOMY Left 2004    Family History:  Family History  Problem Relation Age of Onset  . Arthritis Mother   . Glaucoma Mother   . Thyroid disease Mother   . Heart disease Father   . Cancer  Paternal Grandmother     pancreatic  . Mental illness Son     Social History:  reports that she has been smoking.  She has never used smokeless tobacco. She reports that she drinks alcohol. She reports that she does not use drugs.  Additional Social History:  Alcohol / Drug Use Pain Medications: See PTA Prescriptions: See PTA Over the Counter: See PTA History of alcohol / drug use?: No history of alcohol / drug abuse Longest period of sobriety (when/how long): n/a Negative Consequences of Use:  (n/a) Withdrawal Symptoms:  (n/a)  CIWA: CIWA-Ar BP: 106/60 Pulse Rate: 77 COWS:    Allergies:  Allergies  Allergen Reactions  . Codeine   . Vicodin [Hydrocodone-Acetaminophen] Other (See Comments)    dizzy    Home Medications:  (Not in a hospital admission)  OB/GYN Status:  No LMP recorded. Patient is not currently having periods (Reason: Perimenopausal).  General Assessment Data [facility] Assessment: In system Is this a Tele or Face-to-Face Assessment?: Face-to-Face Is this an Initial Assessment or a Re-assessment for this encounter?: Initial Assessment Marital status: Married AuroraMaiden name: n/a Is patient pregnant?: No Pregnancy Status: No Living Arrangements: Spouse/significant other, Children Can pt return to current living arrangement?: Yes Admission Status: Voluntary Is patient capable of signing voluntary  admission?: Yes Referral Source: Self/Family/Friend Insurance type: Medicaid  Medical Screening Exam [facility] Walk-in ONLY) Medical Exam completed: Yes  Crisis Care Plan Living Arrangements: Spouse/significant other, Children Legal Guardian: Other: (Self) Name of Psychiatrist: Reports of none Name of Therapist: Reports of none  Education Status Is patient currently in school?: No Current Grade: n/a Highest grade of school patient has completed: n/a Name of school: n/a Contact person: n/a  Risk to self with the past 6  months Suicidal Ideation: No Has patient been a risk to self within the past 6 months prior to admission? : No Suicidal Intent: No Has patient had any suicidal intent within the past 6 months prior to admission? : No Is patient at risk for suicide?: No Suicidal Plan?: No Has patient had any suicidal plan within the past 6 months prior to admission? : No Access to Means: No What has been your use of drugs/alcohol within the last 12 months?: Reports of none Previous Attempts/Gestures: No How many times?: 0 Other Self Harm Risks: Reports of none Triggers for Past Attempts: None known Intentional Self Injurious Behavior: None Family Suicide History: No Recent stressful life event(s): Conflict (Comment), Loss (Comment), Other (Comment), Trauma (Comment) Persecutory voices/beliefs?: No Depression: Yes Depression Symptoms: Feeling worthless/self pity, Loss of interest in usual pleasures, Guilt, Fatigue Substance abuse history and/or treatment for substance abuse?: No Suicide prevention information given to non-admitted patients: Not applicable  Risk to Others within the past 6 months Homicidal Ideation: No Does patient have any lifetime risk of violence toward others beyond the six months prior to admission? : No Thoughts of Harm to Others: No Current Homicidal Intent: No Current Homicidal Plan: No Access to Homicidal Means: No Identified Victim: Reports of none History of harm to others?: No Assessment of Violence: None Noted Violent Behavior Description: Reports of none Does patient have access to weapons?: No Criminal Charges Pending?: No Does patient have a court date: No Is patient on probation?: No  Psychosis Hallucinations: None noted Delusions: None noted  Mental Status Report Appearance/Hygiene: Unremarkable Eye Contact: Good Motor Activity: Unremarkable, Freedom of movement Speech: Logical/coherent Level of Consciousness: Alert, Irritable Mood: Anxious,  Irritable Affect: Anxious, Irritable, Appropriate to circumstance Anxiety Level: Moderate Thought Processes: Coherent, Relevant Judgement: Unimpaired Orientation: Person, Place, Time, Situation, Appropriate for developmental age Obsessive Compulsive Thoughts/Behaviors: None  Cognitive Functioning Concentration: Normal Memory: Recent Intact, Remote Intact IQ: Average Insight: Fair Impulse Control: Fair Appetite: Good Weight Loss: 0 Weight Gain: 0 Sleep: No Change Total Hours of Sleep: 8 Vegetative Symptoms: None  ADLScreening [facility] Assessment Services) Patient's cognitive ability adequate to safely complete daily activities?: Yes Patient able to express need for assistance with ADLs?: Yes Independently performs ADLs?: Yes (appropriate for developmental age)  Prior Inpatient Therapy Prior Inpatient Therapy: No Prior Therapy Dates: Reports of none Prior Therapy Facilty/Provider(s): Reports of none Reason for Treatment: Reports of none  Prior Outpatient Therapy Prior Outpatient Therapy: Yes Prior Therapy Dates: 2016 Prior Therapy Facilty/Provider(s): Private Office Reason for Treatment: PTSD Does patient have an ACCT team?: No Does patient have Intensive In-House Services?  : No Does patient have [facility] services? : No Does patient have P4CC services?: No  ADL Screening (condition at time of admission) Patient's cognitive ability adequate to safely complete daily activities?: Yes Is the patient deaf or have difficulty hearing?: No Does the patient have difficulty seeing, even when wearing glasses/contacts?: No Does the patient have difficulty concentrating, remembering, or making decisions?: No Patient able to express need for assistance  with ADLs?: Yes Does the patient have difficulty dressing or bathing?: No Independently performs ADLs?: Yes (appropriate for developmental age) Does the patient have difficulty walking or climbing stairs?: No Weakness of Legs:  None Weakness of Arms/Hands: None  Home Assistive Devices/Equipment Home Assistive Devices/Equipment: None  Therapy Consults (therapy consults require a physician order) PT Evaluation Needed: No OT Evalulation Needed: No SLP Evaluation Needed: No Abuse/Neglect Assessment (Assessment to be complete while patient is alone) Physical Abuse: Denies Verbal Abuse: Denies Sexual Abuse: Denies Exploitation of patient/patient's resources: Denies Self-Neglect: Denies Values / Beliefs Cultural Requests During Hospitalization: None Spiritual Requests During Hospitalization: None Consults Spiritual Care Consult Needed: No Social Work Consult Needed: No Merchant navy officer (For Healthcare) Does Patient Have a Medical Advance Directive?: No    Additional Information 1:1 In Past 12 Months?: No CIRT Risk: No Elopement Risk: No Does patient have medical clearance?: Yes  Child/Adolescent Assessment Running Away Risk: Denies (Patient is an adult)  Disposition:  Disposition Initial Assessment Completed for this Encounter: Yes Disposition of Patient: Other dispositions (ER MD ordered Psych Consult )  On Site Evaluation by:   Reviewed with Physician:    Lilyan Gilford MS, LCAS, LPC, NCC, CCSI Therapeutic Triage Specialist 04/23/2016 4:26 PM

## 2016-04-23 NOTE — Progress Notes (Signed)
   There were no vitals taken for this visit.   Subjective:    Patient ID: Genia DelAntoinette E Covault, female    DOB: [birth year], 51 y.o.   MRN: [id number]  HPI: Genia Delntoinette E Morash is a 51 y.o. female  No chief complaint on file.  Pt came in today urgently about 3 hours before her appointment clutching her chest, rushing to the clinical area demanding treatment for her "pounding heart" and to "just make it stop."  Unable to be consoled and unable to get off the floor due to severity of symptoms.  Notes multiple stressors.    Relevant past medical, surgical, family and social history reviewed and updated as indicated. Interim medical history since our last visit reviewed. Allergies and medications reviewed and updated.  Review of Systems  Per HPI unless specifically indicated above     Objective:    There were no vitals taken for this visit.  Wt Readings from Last 3 Encounters:  12/19/15 91 lb 12.8 oz (41.6 kg)  06/02/15 93 lb 6.4 oz (42.4 kg)  09/13/14 91 lb 3.2 oz (41.4 kg)    Physical Exam  Constitutional: She appears distressed.  Psychiatric: Her mood appears anxious. She is agitated and hyperactive.   EKG done by EMS Results for orders placed or performed in visit on 05/21/15  HM MAMMOGRAPHY  Result Value Ref Range   HM Mammogram from PP       Assessment & Plan:   Problem List Items Addressed This Visit      Unprioritized   Panic    Pt begged to "make it stop" Unable to treat for acute behavioral issues, [facility].  Rescue called as she is unable to transport herself.  Husband called.            Follow up plan: Return for to ER.

## 2016-04-28 ENCOUNTER — Ambulatory Visit (INDEPENDENT_AMBULATORY_CARE_PROVIDER_SITE_OTHER): Payer: Medicaid Other | Admitting: Unknown Physician Specialty

## 2016-04-28 ENCOUNTER — Encounter: Payer: Self-pay | Admitting: Unknown Physician Specialty

## 2016-04-28 DIAGNOSIS — F410 Panic disorder [episodic paroxysmal anxiety] without agoraphobia: Secondary | ICD-10-CM | POA: Diagnosis not present

## 2016-04-28 DIAGNOSIS — G894 Chronic pain syndrome: Secondary | ICD-10-CM | POA: Diagnosis not present

## 2016-04-28 NOTE — Assessment & Plan Note (Signed)
Will start titrating clonazepam.  She is currently taking 1/2 BID.  Go down to taking 1/4 bid or twice a day.

## 2016-04-28 NOTE — Assessment & Plan Note (Signed)
Once off Clonazepam wants to start titrating off Oxycodone.

## 2016-04-28 NOTE — Progress Notes (Signed)
   BP 118/77 (BP Location: Left Arm, Patient Position: Sitting, Cuff Size: Normal)   Pulse 91   Temp 98.6 F (37 C)   Ht 5' (1.524 m)   Wt 90 lb 11.2 oz (41.1 kg)   SpO2 97%   BMI 17.71 kg/m    Subjective:    Patient ID: Annette Atkins, female    DOB: October 19, 1965, 51 y.o.   MRN: [id number]  HPI: Annette Delntoinette E Maffeo is a 51 y.o. female  Chief Complaint  Patient presents with  . Hospitalization Follow-up    panic attack   Panic attack See last note in which she was sent to the ER for having panic attack.  She is doing better.  She and her husband both want to stop the medication she is taking.  Feels she is more empowered now and wants to start coming off all her medications.    Relevant past medical, surgical, family and social history reviewed and updated as indicated. Interim medical history since our last visit reviewed. Allergies and medications reviewed and updated.  Review of Systems  Per HPI unless specifically indicated above    Objective:    BP 118/77 (BP Location: Left Arm, Patient Position: Sitting, Cuff Size: Normal)   Pulse 91   Temp 98.6 F (37 C)   Ht 5' (1.524 m)   Wt 90 lb 11.2 oz (41.1 kg)   SpO2 97%   BMI 17.71 kg/m   Wt Readings from Last 3 Encounters:  04/28/16 90 lb 11.2 oz (41.1 kg)  12/19/15 91 lb 12.8 oz (41.6 kg)  06/02/15 93 lb 6.4 oz (42.4 kg)    Physical Exam  Constitutional: She is oriented to person, place, and time. She appears well-developed and well-nourished. No distress.  HENT:  Head: Normocephalic and atraumatic.  Eyes: Conjunctivae and lids are normal. Right eye exhibits no discharge. Left eye exhibits no discharge. No scleral icterus.  Neck: Normal range of motion. Neck supple. No JVD present. Carotid bruit is not present.  Cardiovascular: Normal rate, regular rhythm and normal heart sounds.   Pulmonary/Chest: Effort normal and breath sounds normal.  Abdominal: Normal appearance. There is no splenomegaly or  hepatomegaly.  Musculoskeletal: Normal range of motion.  Neurological: She is alert and oriented to person, place, and time.  Skin: Skin is warm, dry and intact. No rash noted. No pallor.  Psychiatric: She has a normal mood and affect. Her behavior is normal. Judgment and thought content normal.      Assessment & Plan:   Problem List Items Addressed This Visit      Unprioritized   Chronic pain    Once off Clonazepam wants to start titrating off Oxycodone.        Panic attacks    Will start titrating clonazepam.  She is currently taking 1/2 BID.  Go down to taking 1/4 bid or twice a day.           Next month rx .5 of Clonazepam     Follow up plan: Return in about 3 months (around 07/26/2016).

## 2016-05-11 ENCOUNTER — Other Ambulatory Visit: Payer: Self-pay | Admitting: Unknown Physician Specialty

## 2016-05-11 MED ORDER — OXYCODONE-ACETAMINOPHEN 5-325 MG PO TABS: 1.0000 | ORAL_TABLET | Freq: Three times a day (TID) | ORAL | 0 refills | Status: DC | PRN

## 2016-05-11 MED ORDER — CLONAZEPAM 0.5 MG PO TABS: ORAL_TABLET | ORAL | 0 refills | Status: DC

## 2016-05-25 ENCOUNTER — Other Ambulatory Visit: Payer: Self-pay | Admitting: Unknown Physician Specialty

## 2016-05-28 ENCOUNTER — Encounter: Payer: Self-pay | Admitting: Unknown Physician Specialty

## 2016-06-16 ENCOUNTER — Other Ambulatory Visit: Payer: Self-pay | Admitting: Unknown Physician Specialty

## 2016-06-16 MED ORDER — OXYCODONE-ACETAMINOPHEN 5-325 MG PO TABS: 1.0000 | ORAL_TABLET | Freq: Three times a day (TID) | ORAL | 0 refills | Status: DC | PRN

## 2016-07-05 ENCOUNTER — Telehealth: Payer: Self-pay | Admitting: Unknown Physician Specialty

## 2016-07-05 MED ORDER — QUETIAPINE FUMARATE 50 MG PO TABS: 100.0000 mg | ORAL_TABLET | Freq: Every day | ORAL | 3 refills | Status: DC

## 2016-07-05 NOTE — Telephone Encounter (Signed)
Routing to provider  

## 2016-07-13 ENCOUNTER — Other Ambulatory Visit: Payer: Self-pay | Admitting: Unknown Physician Specialty

## 2016-07-13 MED ORDER — CLONAZEPAM 0.5 MG PO TABS: ORAL_TABLET | ORAL | 0 refills | Status: DC

## 2016-07-13 MED ORDER — OXYCODONE-ACETAMINOPHEN 5-325 MG PO TABS: 1.0000 | ORAL_TABLET | Freq: Three times a day (TID) | ORAL | 0 refills | Status: DC | PRN

## 2016-07-19 ENCOUNTER — Other Ambulatory Visit: Payer: Medicaid Other

## 2016-07-27 ENCOUNTER — Encounter: Payer: Self-pay | Admitting: Unknown Physician Specialty

## 2016-07-27 ENCOUNTER — Ambulatory Visit (INDEPENDENT_AMBULATORY_CARE_PROVIDER_SITE_OTHER): Payer: Medicaid Other | Admitting: Unknown Physician Specialty

## 2016-07-27 DIAGNOSIS — G894 Chronic pain syndrome: Secondary | ICD-10-CM

## 2016-07-27 DIAGNOSIS — F410 Panic disorder [episodic paroxysmal anxiety] without agoraphobia: Secondary | ICD-10-CM

## 2016-07-27 MED ORDER — CLONAZEPAM 0.5 MG PO TABS: ORAL_TABLET | ORAL | 0 refills | Status: DC

## 2016-07-27 MED ORDER — ATENOLOL 25 MG PO TABS: 12.5000 mg | ORAL_TABLET | Freq: Every day | ORAL | 3 refills | Status: DC

## 2016-07-27 NOTE — Assessment & Plan Note (Addendum)
Pt willing to begin being weaned off her medication.  Will cut back 1/2 tab/day.  Next rx will be 70 pills but will increase her clonazepam to 1 QD

## 2016-07-27 NOTE — Progress Notes (Signed)
BP 131/83   Pulse 76   Temp 98.5 F (36.9 C)   Wt 95 lb (43.1 kg)   SpO2 98%   BMI 18.55 kg/m    Subjective:    Patient ID: Annette Atkins, female    DOB: 1965/08/14, 51 y.o.   MRN: [id number]  HPI: Annette Atkins is a 51 y.o. female  Chief Complaint  Patient presents with  . Anxiety    3 month f/up   Anxiety Pt states not having the Clonazepam is not working for her at this time. Her son is a behavior problem and is leaving home soon.  States not having the Clonazepam is a problem at this time.  Her son will be ready to leave in August and willing to engage in this conversation again.  She is getting palpitations.    Chronic pain   She would still like to come off of her Oxycodone and is willing to begin weaning off her Oxycodone.      Relevant past medical, surgical, family and social history reviewed and updated as indicated. Interim medical history since our last visit reviewed. Allergies and medications reviewed and updated.  Review of Systems  Per HPI unless specifically indicated above     Objective:    BP 131/83   Pulse 76   Temp 98.5 F (36.9 C)   Wt 95 lb (43.1 kg)   SpO2 98%   BMI 18.55 kg/m   Wt Readings from Last 3 Encounters:  07/27/16 95 lb (43.1 kg)  04/28/16 90 lb 11.2 oz (41.1 kg)  12/19/15 91 lb 12.8 oz (41.6 kg)    Physical Exam  Results for orders placed or performed during the hospital encounter of 04/23/16  Comprehensive metabolic panel  Result Value Ref Range   Sodium 138 135 - 145 mmol/L   Potassium 3.5 3.5 - 5.1 mmol/L   Chloride 104 101 - 111 mmol/L   CO2 27 22 - 32 mmol/L   Glucose, Bld 91 65 - 99 mg/dL   BUN 12 6 - 20 mg/dL   Creatinine, Ser 4.09 0.44 - 1.00 mg/dL   Calcium 9.8 8.9 - 81.1 mg/dL   Total Protein 7.2 6.5 - 8.1 g/dL   Albumin 4.6 3.5 - 5.0 g/dL   AST 27 15 - 41 U/L   ALT 16 14 - 54 U/L   Alkaline Phosphatase 67 38 - 126 U/L   Total Bilirubin 0.6 0.3 - 1.2 mg/dL   GFR calc non Af Amer >60 >60  mL/min   GFR calc Af Amer >60 >60 mL/min   Anion gap 7 5 - 15  Ethanol  Result Value Ref Range   Alcohol, Ethyl (B) <5 <5 mg/dL  Salicylate level  Result Value Ref Range   Salicylate Lvl <7.0 2.8 - 30.0 mg/dL  Acetaminophen level  Result Value Ref Range   Acetaminophen (Tylenol), Serum <10 (L) 10 - 30 ug/mL  cbc  Result Value Ref Range   WBC 6.7 3.6 - 11.0 K/uL   RBC 4.02 3.80 - 5.20 MIL/uL   Hemoglobin 12.5 12.0 - 16.0 g/dL   HCT 91.4 78.2 - 95.6 %   MCV 90.2 80.0 - 100.0 fL   MCH 31.1 26.0 - 34.0 pg   MCHC 34.5 32.0 - 36.0 g/dL   RDW 21.3 08.6 - 57.8 %   Platelets 250 150 - 440 K/uL  Troponin I  Result Value Ref Range   Troponin I <0.03 <0.03 ng/mL  Assessment & Plan:   Problem List Items Addressed This Visit      Unprioritized   Chronic pain    Pt willing to begin being weaned off her medication.  Will cut back 1/2 tab/day.  Next rx will be 70 pills but will increase her clonazepam to 1 QD      Relevant Medications   clonazePAM (KLONOPIN) 0.5 MG tablet   Panic attacks    Increase clonazepam to 1 pill/day.  Start Atenolol daily for panic symptoms.            Follow up plan: Return in about 3 months (around 10/27/2016) for reassessment when family circumstances change .

## 2016-07-27 NOTE — Assessment & Plan Note (Signed)
Increase clonazepam to 1 pill/day.  Start Atenolol daily for panic symptoms.

## 2016-08-10 ENCOUNTER — Ambulatory Visit: Payer: Self-pay | Admitting: Unknown Physician Specialty

## 2016-08-11 ENCOUNTER — Other Ambulatory Visit: Payer: Self-pay | Admitting: Unknown Physician Specialty

## 2016-08-11 MED ORDER — CLONAZEPAM 0.5 MG PO TABS: ORAL_TABLET | ORAL | 0 refills | Status: DC

## 2016-08-11 MED ORDER — OXYCODONE-ACETAMINOPHEN 5-325 MG PO TABS: 1.0000 | ORAL_TABLET | Freq: Three times a day (TID) | ORAL | 0 refills | Status: DC | PRN

## 2016-08-13 ENCOUNTER — Telehealth: Payer: Self-pay | Admitting: Unknown Physician Specialty

## 2016-08-13 MED ORDER — ATENOLOL 25 MG PO TABS: 25.0000 mg | ORAL_TABLET | Freq: Every day | ORAL | 3 refills | Status: DC

## 2016-08-13 NOTE — Telephone Encounter (Signed)
Routing to provider. Please look at directions on prescription, patient states she is taking 1 tablet daily now.

## 2016-08-13 NOTE — Telephone Encounter (Signed)
Patient needs refill sent to [facility] on her Atenolol since she started taking 1/2 tablet for the first week and then was to increase to 1 tab daily on the second week.  [facility]  Thank [place]

## 2016-08-13 NOTE — Telephone Encounter (Signed)
Called and left patient a VM (not detailed) letting her know that her prescription was sent in to her pharmacy.

## 2016-09-06 ENCOUNTER — Ambulatory Visit: Payer: Medicaid Other | Admitting: Unknown Physician Specialty

## 2016-09-08 ENCOUNTER — Other Ambulatory Visit: Payer: Self-pay | Admitting: Unknown Physician Specialty

## 2016-09-08 MED ORDER — OXYCODONE-ACETAMINOPHEN 5-325 MG PO TABS: 1.0000 | ORAL_TABLET | Freq: Three times a day (TID) | ORAL | 0 refills | Status: DC | PRN

## 2016-09-08 MED ORDER — CLONAZEPAM 0.5 MG PO TABS: ORAL_TABLET | ORAL | 0 refills | Status: DC

## 2016-09-24 ENCOUNTER — Telehealth: Payer: Self-pay | Admitting: Unknown Physician Specialty

## 2016-09-24 ENCOUNTER — Other Ambulatory Visit: Payer: Self-pay | Admitting: Unknown Physician Specialty

## 2016-09-24 MED ORDER — CLONAZEPAM 0.5 MG PO TABS: ORAL_TABLET | ORAL | 0 refills | Status: DC

## 2016-09-24 MED ORDER — OXYCODONE-ACETAMINOPHEN 5-325 MG PO TABS: 1.0000 | ORAL_TABLET | Freq: Three times a day (TID) | ORAL | 0 refills | Status: DC | PRN

## 2016-09-24 NOTE — Telephone Encounter (Signed)
Medication for percocet [pain medication needs prior-authorization. Patient uses [facility].   Patient will call and check with [facility].   Patient asked if someone could leave a detailed voice mail due to her phone being messed up.  Please Advise.  Thank you

## 2016-09-24 NOTE — Telephone Encounter (Signed)
Spoke with Keri regarding this PA. She states that she has done the PA and it was denied the first time so she has resubmitted another one today with further information. She states that if the patient needs the medication now, she can only be given a 5 day supply per new laws. I called and left the patient a VM as requested, letting her know this and asked for her to give us a call if she was completely out of her medication and needed some now.

## 2016-09-24 NOTE — Telephone Encounter (Signed)
This is a medicaid PA which I cannot do, Lorina RabonKeri is the only one who can. Will route to her box.

## 2016-09-29 NOTE — Telephone Encounter (Signed)
PA with further documentation in review.

## 2016-10-04 NOTE — Telephone Encounter (Signed)
Anything new regarding this?

## 2016-10-04 NOTE — Telephone Encounter (Signed)
Approved  Dates: 09/29/2016 - 12/28/2016

## 2016-10-04 NOTE — Telephone Encounter (Signed)
Called and notified pharmacy that medication was approved. Also called patient and let her know that her medication was approved. Left her a VM as she asked for us to do.

## 2016-10-20 ENCOUNTER — Other Ambulatory Visit: Payer: Self-pay | Admitting: Unknown Physician Specialty

## 2016-10-20 MED ORDER — OXYCODONE-ACETAMINOPHEN 5-325 MG PO TABS: 1.0000 | ORAL_TABLET | Freq: Three times a day (TID) | ORAL | 0 refills | Status: DC | PRN

## 2016-10-20 MED ORDER — CLONAZEPAM 0.5 MG PO TABS: ORAL_TABLET | ORAL | 0 refills | Status: DC

## 2016-11-05 ENCOUNTER — Other Ambulatory Visit: Payer: Self-pay | Admitting: Unknown Physician Specialty

## 2016-11-05 MED ORDER — OXYCODONE-ACETAMINOPHEN 5-325 MG PO TABS: 1.0000 | ORAL_TABLET | Freq: Three times a day (TID) | ORAL | 0 refills | Status: DC | PRN

## 2016-11-05 MED ORDER — CLONAZEPAM 0.5 MG PO TABS: ORAL_TABLET | ORAL | 0 refills | Status: DC

## 2016-11-12 ENCOUNTER — Ambulatory Visit: Payer: Self-pay | Admitting: Family Medicine

## 2016-12-06 ENCOUNTER — Encounter: Payer: Self-pay | Admitting: Unknown Physician Specialty

## 2016-12-06 ENCOUNTER — Ambulatory Visit (INDEPENDENT_AMBULATORY_CARE_PROVIDER_SITE_OTHER): Payer: Medicaid Other | Admitting: Unknown Physician Specialty

## 2016-12-06 VITALS — BP 117/81 | HR 72 | Temp 98.4°F | Wt 105.2 lb

## 2016-12-06 DIAGNOSIS — G44329 Chronic post-traumatic headache, not intractable: Secondary | ICD-10-CM

## 2016-12-06 DIAGNOSIS — R29818 Other symptoms and signs involving the nervous system: Secondary | ICD-10-CM

## 2016-12-06 DIAGNOSIS — S060X0S Concussion without loss of consciousness, sequela: Secondary | ICD-10-CM

## 2016-12-06 DIAGNOSIS — J0100 Acute maxillary sinusitis, unspecified: Secondary | ICD-10-CM | POA: Diagnosis not present

## 2016-12-06 DIAGNOSIS — R42 Dizziness and giddiness: Secondary | ICD-10-CM | POA: Diagnosis not present

## 2016-12-06 DIAGNOSIS — J329 Chronic sinusitis, unspecified: Secondary | ICD-10-CM | POA: Insufficient documentation

## 2016-12-06 DIAGNOSIS — S069X0A Unspecified intracranial injury without loss of consciousness, initial encounter: Secondary | ICD-10-CM

## 2016-12-06 MED ORDER — SUMATRIPTAN SUCCINATE 100 MG PO TABS: 100.0000 mg | ORAL_TABLET | Freq: Once | ORAL | 0 refills | Status: DC

## 2016-12-06 MED ORDER — MECLIZINE HCL 25 MG PO TABS: 25.0000 mg | ORAL_TABLET | Freq: Three times a day (TID) | ORAL | 0 refills | Status: DC | PRN

## 2016-12-06 MED ORDER — AMOXICILLIN-POT CLAVULANATE 875-125 MG PO TABS: 1.0000 | ORAL_TABLET | Freq: Two times a day (BID) | ORAL | 0 refills | Status: DC

## 2016-12-06 NOTE — Progress Notes (Signed)
BP 117/81   Pulse 72   Temp 98.4 F (36.9 C)   Wt 105 lb 3.2 oz (47.7 kg)   SpO2 99%   BMI 20.55 kg/m    Subjective:    Patient ID: Annette Atkins, female    DOB: 05-17-1965, 51 y.o.   MRN: [id number]  HPI: Annette Atkins is a 51 y.o. female  Chief Complaint  Patient presents with  . Dizziness    pt states that she has had a migraine for 3 weeks. She states that 2 weeks ago, she hit her hard very hard on a wood beam and has been off balance since, left side face tingling, and left ear pain    Pt with the complaints as above.  States she hit her head, never lost consciousness but saw stars.  Hit the left part of her head.  States she is nauseated, can't drive, dizzy all the time, no sense of balance, double vision, some tingling below left eye and now a "headache" below the left eye.  Has a weird sensation with swallowing.  States she wants to sleep most of the time.  She is scared.    Sinusitis  This is a new problem. The current episode started today. The problem has been gradually worsening since onset. Associated symptoms include chills, congestion, coughing, ear pain and sinus pressure. Past treatments include nothing.    Relevant past medical, surgical, family and social history reviewed and updated as indicated. Interim medical history since our last visit reviewed. Allergies and medications reviewed and updated.  Review of Systems  Constitutional: Positive for chills.  HENT: Positive for congestion, ear pain and sinus pressure.   Respiratory: Positive for cough.     Per HPI unless specifically indicated above     Objective:    BP 117/81   Pulse 72   Temp 98.4 F (36.9 C)   Wt 105 lb 3.2 oz (47.7 kg)   SpO2 99%   BMI 20.55 kg/m   Wt Readings from Last 3 Encounters:  12/06/16 105 lb 3.2 oz (47.7 kg)  07/27/16 95 lb (43.1 kg)  04/28/16 90 lb 11.2 oz (41.1 kg)    Physical Exam  Constitutional: She is oriented to person, place, and time. She  appears well-developed and well-nourished. No distress.  HENT:  Head: Normocephalic and atraumatic.  Right Ear: Tympanic membrane and ear canal normal.  Left Ear: Tympanic membrane and ear canal normal.  Nose: No rhinorrhea. Right sinus exhibits no frontal sinus tenderness. Left sinus exhibits maxillary sinus tenderness. Left sinus exhibits no frontal sinus tenderness.  Eyes: Conjunctivae and lids are normal. Right eye exhibits no discharge. Left eye exhibits no discharge. No scleral icterus.  Cardiovascular: Normal rate and regular rhythm.   Pulmonary/Chest: Effort normal and breath sounds normal. No respiratory distress.  Abdominal: Normal appearance. There is no splenomegaly or hepatomegaly.  Musculoskeletal: Normal range of motion.  Neurological: She is alert and oriented to person, place, and time. No cranial nerve deficit or sensory deficit. Coordination and gait normal.  Skin: Skin is intact. No rash noted. No pallor.  Psychiatric: She has a normal mood and affect. Her behavior is normal. Judgment and thought content normal.    Results for orders placed or performed during the hospital encounter of 04/23/16  Comprehensive metabolic panel  Result Value Ref Range   Sodium 138 135 - 145 mmol/L   Potassium 3.5 3.5 - 5.1 mmol/L   Chloride 104 101 - 111 mmol/L  CO2 27 22 - 32 mmol/L   Glucose, Bld 91 65 - 99 mg/dL   BUN 12 6 - 20 mg/dL   Creatinine, Ser 1.61 0.44 - 1.00 mg/dL   Calcium 9.8 8.9 - 09.6 mg/dL   Total Protein 7.2 6.5 - 8.1 g/dL   Albumin 4.6 3.5 - 5.0 g/dL   AST 27 15 - 41 U/L   ALT 16 14 - 54 U/L   Alkaline Phosphatase 67 38 - 126 U/L   Total Bilirubin 0.6 0.3 - 1.2 mg/dL   GFR calc non Af Amer >60 >60 mL/min   GFR calc Af Amer >60 >60 mL/min   Anion gap 7 5 - 15  Ethanol  Result Value Ref Range   Alcohol, Ethyl (B) <5 <5 mg/dL  Salicylate level  Result Value Ref Range   Salicylate Lvl <7.0 2.8 - 30.0 mg/dL  Acetaminophen level  Result Value Ref Range    Acetaminophen (Tylenol), Serum <10 (L) 10 - 30 ug/mL  cbc  Result Value Ref Range   WBC 6.7 3.6 - 11.0 K/uL   RBC 4.02 3.80 - 5.20 MIL/uL   Hemoglobin 12.5 12.0 - 16.0 g/dL   HCT 04.5 40.9 - 81.1 %   MCV 90.2 80.0 - 100.0 fL   MCH 31.1 26.0 - 34.0 pg   MCHC 34.5 32.0 - 36.0 g/dL   RDW 91.4 78.2 - 95.6 %   Platelets 250 150 - 440 K/uL  Troponin I  Result Value Ref Range   Troponin I <0.03 <0.03 ng/mL      Assessment & Plan:   Problem List Items Addressed This Visit      Unprioritized   Sinusitis   Relevant Medications   amoxicillin-clavulanate (AUGMENTIN) 875-125 MG tablet   Other Relevant Orders   Ambulatory referral to ENT   Vertigo    Will rx Meclizine though has not helped in the past.  Significant history for vertigo.  Will refer to ENT      Relevant Orders   Ambulatory referral to ENT    Other Visit Diagnoses    Traumatic brain injury, without loss of consciousness, initial encounter [facility])    -  Primary   Relevant Orders   CT Head Wo Contrast   Chronic post-traumatic headache, not intractable       Relevant Medications   SUMAtriptan (IMITREX) 100 MG tablet   Other Relevant Orders   CT Head Wo Contrast   Other symptoms and signs involving the nervous system       Concussion without loss of consciousness, sequela (HCC)       Relevant Orders   CT Head Wo Contrast       Follow up plan: Return for with ENT for vertigo .

## 2016-12-06 NOTE — Assessment & Plan Note (Addendum)
Will rx Meclizine though has not helped in the past.  Significant history for vertigo.  Will refer to ENT

## 2016-12-08 ENCOUNTER — Telehealth: Payer: Self-pay

## 2016-12-08 NOTE — Telephone Encounter (Addendum)
Called patient to notify her of CT appointment.  LVM to return call.  12/21/16 arrive at 8:45AM at [facility]

## 2016-12-09 NOTE — Telephone Encounter (Signed)
Patient returned phone call. Eulis Canner notified patient of appointment.

## 2016-12-14 ENCOUNTER — Telehealth: Payer: Self-pay | Admitting: Unknown Physician Specialty

## 2016-12-14 NOTE — Telephone Encounter (Signed)
Patient called to see if she can get her CT scheduled at an earlier time. Patient is scheduled for CT scan 12/21/2016. Patient states her head and the pain is getting worse. Patient would like to speak with provider about pain and condition.   Please Advise.  Thank you

## 2016-12-14 NOTE — Telephone Encounter (Signed)
I called patient because for her referrals needing to be scheduled, she needs to change PCP on card.   Patient again said her symptoms were worse. I explained to patient that if her symptoms worsened, to call 911 or go to the ER. Patient said she probably should have done that, but she wants to give it one more day. I again explained to patient to ER if concerns were risen and her symptoms have worsened. Patient said she'd see and would like a call back from [place].

## 2016-12-14 NOTE — Telephone Encounter (Signed)
Patient notified and verbalized understanding. 

## 2016-12-14 NOTE — Telephone Encounter (Signed)
Since she is on Medicaid, she should go to the ER for an emergent head CT.  We probably can't get a non-emergent one done sooner.

## 2016-12-17 ENCOUNTER — Other Ambulatory Visit: Payer: Self-pay | Admitting: Unknown Physician Specialty

## 2016-12-17 MED ORDER — OXYCODONE-ACETAMINOPHEN 5-325 MG PO TABS: 1.0000 | ORAL_TABLET | Freq: Three times a day (TID) | ORAL | 0 refills | Status: DC | PRN

## 2016-12-17 MED ORDER — CLONAZEPAM 0.5 MG PO TABS: ORAL_TABLET | ORAL | 0 refills | Status: DC

## 2016-12-21 ENCOUNTER — Ambulatory Visit
Admission: RE | Admit: 2016-12-21 | Discharge: 2016-12-21 | Disposition: A | Discharge status: Home / Self Care | Payer: Medicaid Other | Source: Ambulatory Visit | Attending: Unknown Physician Specialty | Admitting: Unknown Physician Specialty

## 2016-12-21 DIAGNOSIS — G44329 Chronic post-traumatic headache, not intractable: Secondary | ICD-10-CM

## 2016-12-21 DIAGNOSIS — S069X0A Unspecified intracranial injury without loss of consciousness, initial encounter: Secondary | ICD-10-CM

## 2016-12-21 DIAGNOSIS — S060X0S Concussion without loss of consciousness, sequela: Secondary | ICD-10-CM | POA: Insufficient documentation

## 2016-12-21 DIAGNOSIS — X58XXXS Exposure to other specified factors, sequela: Secondary | ICD-10-CM | POA: Insufficient documentation

## 2017-01-14 ENCOUNTER — Other Ambulatory Visit: Payer: Self-pay | Admitting: Unknown Physician Specialty

## 2017-01-14 MED ORDER — QUETIAPINE FUMARATE 50 MG PO TABS: 100.0000 mg | ORAL_TABLET | Freq: Every day | ORAL | 5 refills | Status: DC

## 2017-01-14 NOTE — Telephone Encounter (Signed)
Rx refill

## 2017-01-14 NOTE — Telephone Encounter (Signed)
Copied from CRM #[id number]. Topic: Quick Communication - See Telephone Encounter >> Jan 14, 2017  1:00 PM Everardo [place], Julienne Vogler, [place] wrote: CRM for notification. See Telephone encounter for:  01/14/17. Patient needs a refill on her Seroquel

## 2017-01-14 NOTE — Telephone Encounter (Signed)
Routing to provider  

## 2017-01-31 ENCOUNTER — Other Ambulatory Visit: Payer: Self-pay | Admitting: Unknown Physician Specialty

## 2017-01-31 MED ORDER — OXYCODONE-ACETAMINOPHEN 5-325 MG PO TABS: 1.0000 | ORAL_TABLET | Freq: Three times a day (TID) | ORAL | 0 refills | Status: DC | PRN

## 2017-01-31 MED ORDER — CLONAZEPAM 0.5 MG PO TABS: ORAL_TABLET | ORAL | 0 refills | Status: DC

## 2017-04-13 ENCOUNTER — Other Ambulatory Visit: Payer: Self-pay | Admitting: Unknown Physician Specialty

## 2017-04-13 MED ORDER — CLONAZEPAM 0.5 MG PO TABS: ORAL_TABLET | ORAL | 0 refills | Status: DC

## 2017-04-13 MED ORDER — OXYCODONE-ACETAMINOPHEN 5-325 MG PO TABS: 1.0000 | ORAL_TABLET | Freq: Three times a day (TID) | ORAL | 0 refills | Status: DC | PRN

## 2017-04-19 ENCOUNTER — Other Ambulatory Visit: Payer: Self-pay | Admitting: Unknown Physician Specialty

## 2017-04-19 MED ORDER — CLONAZEPAM 0.5 MG PO TABS: ORAL_TABLET | ORAL | 0 refills | Status: DC

## 2017-04-19 MED ORDER — OXYCODONE-ACETAMINOPHEN 5-325 MG PO TABS: 1.0000 | ORAL_TABLET | Freq: Three times a day (TID) | ORAL | 0 refills | Status: DC | PRN

## 2017-05-02 ENCOUNTER — Ambulatory Visit (INDEPENDENT_AMBULATORY_CARE_PROVIDER_SITE_OTHER): Payer: Self-pay | Admitting: Unknown Physician Specialty

## 2017-05-02 ENCOUNTER — Encounter: Payer: Self-pay | Admitting: Unknown Physician Specialty

## 2017-05-02 DIAGNOSIS — G894 Chronic pain syndrome: Secondary | ICD-10-CM

## 2017-05-02 DIAGNOSIS — F410 Panic disorder [episodic paroxysmal anxiety] without agoraphobia: Secondary | ICD-10-CM

## 2017-05-02 NOTE — Assessment & Plan Note (Signed)
Stable, continue present medications.   

## 2017-05-02 NOTE — Progress Notes (Signed)
   BP 118/80   Pulse 76   Temp 98.3 F (36.8 C) (Oral)   Wt 106 lb 12.8 oz (48.4 kg)   LMP  (LMP Unknown)   SpO2 99%   BMI 20.86 kg/m    Subjective:    Patient ID: Genia DelAntoinette E Bechtel, female    DOB: Apr 24, 1965, 52 y.o.   MRN: [id number]  HPI: Genia Delntoinette E Hamza is a 52 y.o. female  Chief Complaint  Patient presents with  . Anxiety   Anxiety Pt states not having the Clonazepam nightly at .5 mg and is not having heart pounding or palpitations.  Took one month off taking prescriptions.  In general she feels like she is getting better.    Chronic pain   Took a 1 month break from her medications.  She is taking 2 pills/day for #56 in a one month period of time.  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    Relevant past medical, surgical, family and social history reviewed and updated as indicated. Interim medical history since our last visit reviewed. Allergies and medications reviewed and updated.  Review of Systems  Constitutional: Negative.   HENT: Negative.   Eyes: Negative.   Respiratory: Negative.   Cardiovascular: Negative.   Gastrointestinal: Negative.   Endocrine: Negative.   Genitourinary: Negative.   Musculoskeletal: Negative.   Skin: Negative.   Allergic/Immunologic: Negative.   Neurological: Negative.   Hematological: Negative.   Psychiatric/Behavioral: Negative.     Per HPI unless specifically indicated above     Objective:    BP 118/80   Pulse 76   Temp 98.3 F (36.8 C) (Oral)   Wt 106 lb 12.8 oz (48.4 kg)   LMP  (LMP Unknown)   SpO2 99%   BMI 20.86 kg/m   Wt Readings from Last 3 Encounters:  05/02/17 106 lb 12.8 oz (48.4 kg)  12/06/16 105 lb 3.2 oz (47.7 kg)  07/27/16 95 lb (43.1 kg)    Physical Exam  Constitutional: She is oriented to person, place, and time. She appears well-developed and well-nourished. No distress.  HENT:  Head: Normocephalic and atraumatic.  Eyes:  Conjunctivae and lids are normal. Right eye exhibits no discharge. Left eye exhibits no discharge. No scleral icterus.  Neck: Normal range of motion. Neck supple. No JVD present. Carotid bruit is not present.  Cardiovascular: Normal rate, regular rhythm and normal heart sounds.  Pulmonary/Chest: Effort normal and breath sounds normal.  Abdominal: Normal appearance. There is no splenomegaly or hepatomegaly.  Musculoskeletal: Normal range of motion.  Neurological: She is alert and oriented to person, place, and time.  Skin: Skin is warm, dry and intact. No rash noted. No pallor.  Psychiatric: She has a normal mood and affect. Her behavior is normal. Judgment and thought content normal.      Assessment & Plan:   Problem List Items Addressed This Visit      Unprioritized   Chronic pain    Stable, continue present medications.        Panic attacks    Stable, continue present medications.            Follow up plan: Return in about 3 months (around 07/30/2017).

## 2017-05-16 ENCOUNTER — Other Ambulatory Visit: Payer: Self-pay | Admitting: Unknown Physician Specialty

## 2017-05-16 MED ORDER — CLONAZEPAM 0.5 MG PO TABS: ORAL_TABLET | ORAL | 0 refills | Status: DC

## 2017-05-16 MED ORDER — OXYCODONE-ACETAMINOPHEN 5-325 MG PO TABS: 1.0000 | ORAL_TABLET | Freq: Three times a day (TID) | ORAL | 0 refills | Status: DC | PRN

## 2017-06-17 ENCOUNTER — Other Ambulatory Visit: Payer: Self-pay | Admitting: Unknown Physician Specialty

## 2017-06-17 MED ORDER — OXYCODONE-ACETAMINOPHEN 5-325 MG PO TABS: 1.0000 | ORAL_TABLET | Freq: Three times a day (TID) | ORAL | 0 refills | Status: DC | PRN

## 2017-06-17 MED ORDER — CLONAZEPAM 0.5 MG PO TABS: ORAL_TABLET | ORAL | 0 refills | Status: DC

## 2017-07-05 ENCOUNTER — Encounter: Payer: Self-pay | Admitting: Unknown Physician Specialty

## 2017-07-05 ENCOUNTER — Ambulatory Visit (INDEPENDENT_AMBULATORY_CARE_PROVIDER_SITE_OTHER): Payer: Self-pay | Admitting: Unknown Physician Specialty

## 2017-07-05 DIAGNOSIS — G894 Chronic pain syndrome: Secondary | ICD-10-CM

## 2017-07-05 DIAGNOSIS — F419 Anxiety disorder, unspecified: Secondary | ICD-10-CM

## 2017-07-05 DIAGNOSIS — F410 Panic disorder [episodic paroxysmal anxiety] without agoraphobia: Secondary | ICD-10-CM | POA: Insufficient documentation

## 2017-07-05 DIAGNOSIS — Z0289 Encounter for other administrative examinations: Secondary | ICD-10-CM | POA: Insufficient documentation

## 2017-07-05 DIAGNOSIS — F411 Generalized anxiety disorder: Secondary | ICD-10-CM

## 2017-07-05 MED ORDER — OXYCODONE-ACETAMINOPHEN 5-325 MG PO TABS: 1.0000 | ORAL_TABLET | ORAL | 0 refills | Status: DC | PRN

## 2017-07-05 NOTE — Assessment & Plan Note (Signed)
Renewed pain contract.  Drug screen completed

## 2017-07-05 NOTE — Assessment & Plan Note (Addendum)
Pt with chronic pain who went over her monthly allotment.  Discussed I will give her #10 but it will be against our agreement top take more than prescribed.  She will try to ammend activities, particularly work

## 2017-07-05 NOTE — Assessment & Plan Note (Signed)
Good control on present medications.

## 2017-07-05 NOTE — Progress Notes (Signed)
BP 123/77   Pulse 71   Temp 98.5 F (36.9 C) (Oral)   Ht 5' (1.524 m)   Wt 102 lb 14.4 oz (46.7 kg)   LMP  (LMP Unknown)   SpO2 99%   BMI 20.10 kg/m    Subjective:    Patient ID: Annette Atkins, female    DOB: 10-20-1965, 52 y.o.   MRN: [id number]  HPI: Annette Atkins is a 52 y.o. female  Chief Complaint  Patient presents with  . Anxiety  . Pain    pt states she is out of her pain medication and needs more, states something is wrong as far as the 28 day list.    Chronic pain Pt is taking Oxycodone 5/325 mg.  Pt states she ran out early.  States she is sometimes takes her pain medication TID rather than BID.  She feels good and is starting to work.  We wrote  #70 to last 28 days.  Pain is described as lower back and right shoulder.  She has been to the pain clinic and doesn't want to return.  States the pain meds allow her to complete her job.  She feels if she was not taking medication she would have to quit her job.    Anxiety Taking 1 Clonazepam/day which "keeps the anxiety away."   Depression screen [facility] 2/9 07/05/2017 05/02/2017 12/19/2015 06/02/2015  Decreased Interest 0 0 2 0  Down, Depressed, Hopeless 0 0 1 0  PHQ - 2 Score 0 0 3 0  Altered sleeping 0 0 3 -  Tired, decreased energy 0 0 0 -  Change in appetite 0 0 0 -  Feeling bad or failure about yourself  0 0 0 -  Trouble concentrating 0 0 0 -  Moving slowly or fidgety/restless 0 0 0 -  Suicidal thoughts 0 0 0 -  PHQ-9 Score 0 0 6 -  Difficult doing work/chores - - Somewhat difficult -      Relevant past medical, surgical, family and social history reviewed and updated as indicated. Interim medical history since our last visit reviewed. Allergies and medications reviewed and updated.  Review of Systems  Per HPI unless specifically indicated above     Objective:    BP 123/77   Pulse 71   Temp 98.5 F (36.9 C) (Oral)   Ht 5' (1.524 m)   Wt 102 lb 14.4 oz (46.7 kg)   LMP  (LMP Unknown)    SpO2 99%   BMI 20.10 kg/m   Wt Readings from Last 3 Encounters:  07/05/17 102 lb 14.4 oz (46.7 kg)  05/02/17 106 lb 12.8 oz (48.4 kg)  12/06/16 105 lb 3.2 oz (47.7 kg)    Physical Exam  Constitutional: She is oriented to person, place, and time. She appears well-developed and well-nourished. No distress.  HENT:  Head: Normocephalic and atraumatic.  Eyes: Conjunctivae and lids are normal. Right eye exhibits no discharge. Left eye exhibits no discharge. No scleral icterus.  Cardiovascular: Normal rate.  Pulmonary/Chest: Effort normal.  Abdominal: Normal appearance. There is no splenomegaly or hepatomegaly.  Musculoskeletal: Normal range of motion.  Neurological: She is alert and oriented to person, place, and time.  Skin: Skin is intact. No rash noted. No pallor.  Psychiatric: She has a normal mood and affect. Her behavior is normal. Judgment and thought content normal.    Results for orders placed or performed during the hospital encounter of 04/23/16  Comprehensive metabolic panel  Result Value  Ref Range   Sodium 138 135 - 145 mmol/L   Potassium 3.5 3.5 - 5.1 mmol/L   Chloride 104 101 - 111 mmol/L   CO2 27 22 - 32 mmol/L   Glucose, Bld 91 65 - 99 mg/dL   BUN 12 6 - 20 mg/dL   Creatinine, Ser 9.561.00 0.44 - 1.00 mg/dL   Calcium 9.8 8.9 - 21.310.3 mg/dL   Total Protein 7.2 6.5 - 8.1 g/dL   Albumin 4.6 3.5 - 5.0 g/dL   AST 27 15 - 41 U/L   ALT 16 14 - 54 U/L   Alkaline Phosphatase 67 38 - 126 U/L   Total Bilirubin 0.6 0.3 - 1.2 mg/dL   GFR calc non Af Amer >60 >60 mL/min   GFR calc Af Amer >60 >60 mL/min   Anion gap 7 5 - 15  Ethanol  Result Value Ref Range   Alcohol, Ethyl (B) <5 <5 mg/dL  Salicylate level  Result Value Ref Range   Salicylate Lvl <7.0 2.8 - 30.0 mg/dL  Acetaminophen level  Result Value Ref Range   Acetaminophen (Tylenol), Serum <10 (L) 10 - 30 ug/mL  cbc  Result Value Ref Range   WBC 6.7 3.6 - 11.0 K/uL   RBC 4.02 3.80 - 5.20 MIL/uL   Hemoglobin 12.5  12.0 - 16.0 g/dL   HCT 08.636.2 57.835.0 - 46.947.0 %   MCV 90.2 80.0 - 100.0 fL   MCH 31.1 26.0 - 34.0 pg   MCHC 34.5 32.0 - 36.0 g/dL   RDW 62.913.9 52.811.5 - 41.314.5 %   Platelets 250 150 - 440 K/uL  Troponin I  Result Value Ref Range   Troponin I <0.03 <0.03 ng/mL      Assessment & Plan:   Problem List Items Addressed This Visit      Unprioritized   Anxiety    Good control on present medications.        Chronic pain    Pt with chronic pain who went over her monthly allotment.  Discussed I will give her #10 but it will be against our agreement top take more than prescribed.  She will try to ammend activities, particularly work      Relevant Medications   oxyCODONE-acetaminophen (PERCOCET/ROXICET) 5-325 MG tablet   Pain management contract signed    Renewed pain contract.  Drug screen completed      Relevant Orders   Urine drugs of abuse scrn w alc, routine (Ref Lab)       Follow up plan: Return in about 3 months (around 10/04/2017).

## 2017-07-11 LAB — URINE DRUGS OF ABUSE SCREEN W ALC, ROUTINE (REF LAB)
AMPHETAMINES, URINE: NEGATIVE ng/mL
BENZODIAZEPINE QUANT UR: NEGATIVE ng/mL
Barbiturate Quant, Ur: NEGATIVE ng/mL
Cocaine (Metab.): NEGATIVE ng/mL
ETHANOL, URINE: NEGATIVE %
METHADONE SCREEN, URINE: NEGATIVE ng/mL
Opiate Quant, Ur: NEGATIVE ng/mL
PCP QUANT UR: NEGATIVE ng/mL
Propoxyphene: NEGATIVE ng/mL

## 2017-07-11 LAB — PANEL 799049
CANNABINOID GC/MS UR: POSITIVE — AB
CARBOXY THC GC/MS CONF: 37 ng/mL

## 2017-07-17 ENCOUNTER — Other Ambulatory Visit: Payer: Self-pay | Admitting: Unknown Physician Specialty

## 2017-07-18 ENCOUNTER — Telehealth: Payer: Self-pay | Admitting: Unknown Physician Specialty

## 2017-07-18 MED ORDER — QUETIAPINE FUMARATE 50 MG PO TABS: 100.0000 mg | ORAL_TABLET | Freq: Every day | ORAL | 5 refills | Status: DC

## 2017-07-18 NOTE — Telephone Encounter (Signed)
Patient needs a script sent to [place] for her Seroquel.  She is completely out.  Thank You

## 2017-07-29 ENCOUNTER — Ambulatory Visit: Payer: Self-pay | Admitting: Unknown Physician Specialty

## 2017-08-05 ENCOUNTER — Telehealth: Payer: Self-pay | Admitting: Unknown Physician Specialty

## 2017-08-05 MED ORDER — SUMATRIPTAN SUCCINATE 100 MG PO TABS: 100.0000 mg | ORAL_TABLET | Freq: Once | ORAL | 0 refills | Status: DC

## 2017-08-05 NOTE — Telephone Encounter (Signed)
Patient called desperately needing her migraine med sent to [facility].  She has been in bed with migraine.   Thank you

## 2017-08-09 ENCOUNTER — Telehealth: Payer: Self-pay | Admitting: Unknown Physician Specialty

## 2017-08-09 MED ORDER — OXYCODONE-ACETAMINOPHEN 5-325 MG PO TABS: 1.0000 | ORAL_TABLET | Freq: Three times a day (TID) | ORAL | 0 refills | Status: DC | PRN

## 2017-08-09 MED ORDER — CLONAZEPAM 0.5 MG PO TABS: ORAL_TABLET | ORAL | 0 refills | Status: DC

## 2017-08-09 NOTE — Telephone Encounter (Signed)
Written as post-dated prescriptions.  Discuss referral to pain clinic next visit

## 2017-08-09 NOTE — Telephone Encounter (Signed)
Patient has follow up appointment 6/11 but her monthly meds will need to be sent to [facility] on 6/3 Thank you

## 2017-08-15 ENCOUNTER — Telehealth: Payer: Self-pay | Admitting: Unknown Physician Specialty

## 2017-08-15 NOTE — Telephone Encounter (Signed)
Patient is requesting her 28 day scripts be sent over to [facility]   Thank You

## 2017-08-15 NOTE — Telephone Encounter (Signed)
This was done on Friday.

## 2017-08-16 ENCOUNTER — Other Ambulatory Visit: Payer: Self-pay | Admitting: Unknown Physician Specialty

## 2017-08-16 MED ORDER — OXYCODONE-ACETAMINOPHEN 5-325 MG PO TABS: 1.0000 | ORAL_TABLET | Freq: Three times a day (TID) | ORAL | 0 refills | Status: DC | PRN

## 2017-08-23 ENCOUNTER — Encounter: Payer: Self-pay | Admitting: Unknown Physician Specialty

## 2017-08-23 ENCOUNTER — Ambulatory Visit (INDEPENDENT_AMBULATORY_CARE_PROVIDER_SITE_OTHER): Payer: Self-pay | Admitting: Unknown Physician Specialty

## 2017-08-23 DIAGNOSIS — F419 Anxiety disorder, unspecified: Secondary | ICD-10-CM

## 2017-08-23 DIAGNOSIS — G894 Chronic pain syndrome: Secondary | ICD-10-CM

## 2017-08-23 MED ORDER — OXYCODONE-ACETAMINOPHEN 5-325 MG PO TABS: 1.0000 | ORAL_TABLET | Freq: Two times a day (BID) | ORAL | 0 refills | Status: DC

## 2017-08-23 MED ORDER — OXYCODONE-ACETAMINOPHEN 5-325 MG PO TABS: 1.0000 | ORAL_TABLET | ORAL | 0 refills | Status: DC | PRN

## 2017-08-23 MED ORDER — CLONAZEPAM 0.5 MG PO TABS: ORAL_TABLET | ORAL | 2 refills | Status: DC

## 2017-08-23 MED ORDER — OXYCODONE-ACETAMINOPHEN 5-325 MG PO TABS: 1.0000 | ORAL_TABLET | Freq: Two times a day (BID) | ORAL | 0 refills | Status: DC | PRN

## 2017-08-23 NOTE — Assessment & Plan Note (Signed)
Pt on chronic opioid management.  She does not want to go to the pain clinic and will plan a titration schedule.  Will rx Oxycodone 5 mg BID for 3 months.  In 3 months, we will decrease to 1/day.  Consider low dose naltrexone.

## 2017-08-23 NOTE — Patient Instructions (Signed)
Low dose Naltrexone

## 2017-08-23 NOTE — Assessment & Plan Note (Signed)
Ok for once a day as we titrate down Oxycodone.

## 2017-08-23 NOTE — Progress Notes (Signed)
BP 103/69   Pulse 61   Temp 98.5 F (36.9 C) (Oral)   Ht 5' (1.524 m)   Wt 104 lb 3.2 oz (47.3 kg)   LMP  (LMP Unknown)   SpO2 97%   BMI 20.35 kg/m    Subjective:    Patient ID: Annette Atkins, female    DOB: October 08, 1965, 52 y.o.   MRN: [id number]  HPI: Annette Atkins is a 52 y.o. female  Chief Complaint  Patient presents with  . Depression  . Pain   Chronic Pain Pt is here for refill of her Oxycodone 5/325 mg BID.  By taking this dose, she is able to work on a daily basis.  States she tries to take one month off of her pain meds to reduce tolerance.  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  She has plans to go to [facility] and having an easier lifestyle  Insomnia/anxiety Takes Clonazepam .5mg  nightly.  States this helps her manage her anxiety and panic.     Relevant past medical, surgical, family and social history reviewed and updated as indicated. Interim medical history since our last visit reviewed. Allergies and medications reviewed and updated.  Review of Systems  Per HPI unless specifically indicated above     Objective:    BP 103/69   Pulse 61   Temp 98.5 F (36.9 C) (Oral)   Ht 5' (1.524 m)   Wt 104 lb 3.2 oz (47.3 kg)   LMP  (LMP Unknown)   SpO2 97%   BMI 20.35 kg/m   Wt Readings from Last 3 Encounters:  08/23/17 104 lb 3.2 oz (47.3 kg)  07/05/17 102 lb 14.4 oz (46.7 kg)  05/02/17 106 lb 12.8 oz (48.4 kg)    Physical Exam  Constitutional: She is oriented to person, place, and time. She appears well-developed and well-nourished. No distress.  HENT:  Head: Normocephalic and atraumatic.  Eyes: Conjunctivae and lids are normal. Right eye exhibits no discharge. Left eye exhibits no discharge. No scleral icterus.  Neck: Normal range of motion. Neck supple. No JVD present. Carotid bruit is not present.  Cardiovascular: Normal rate, regular rhythm and normal heart sounds.  Pulmonary/Chest: Effort normal  and breath sounds normal.  Abdominal: Normal appearance. There is no splenomegaly or hepatomegaly.  Musculoskeletal: Normal range of motion.  Neurological: She is alert and oriented to person, place, and time.  Skin: Skin is warm, dry and intact. No rash noted. No pallor.  Psychiatric: She has a normal mood and affect. Her behavior is normal. Judgment and thought content normal.      Assessment & Plan:   Problem List Items Addressed This Visit      Unprioritized   Anxiety    Ok for once a day as we titrate down Oxycodone.        Chronic pain    Pt on chronic opioid management.  She does not want to go to the pain clinic and will plan a titration schedule.  Will rx Oxycodone 5 mg BID for 3 months.  In 3 months, we will decrease to 1/day.  Consider low dose naltrexone.        Relevant Medications   oxyCODONE-acetaminophen (ROXICET) 5-325 MG tablet (Start on 09/13/2017)   oxyCODONE-acetaminophen (PERCOCET/ROXICET) 5-325 MG tablet (Start on 10/11/2017)   oxyCODONE-acetaminophen (PERCOCET/ROXICET) 5-325 MG tablet (Start on 11/08/2017)   clonazePAM (KLONOPIN) 0.5 MG tablet       Follow up  plan: Return in about 3 months (around 11/23/2017).

## 2017-09-06 ENCOUNTER — Telehealth: Payer: Self-pay | Admitting: Unknown Physician Specialty

## 2017-09-06 MED ORDER — PROMETHAZINE HCL 25 MG RE SUPP: 25.0000 mg | Freq: Four times a day (QID) | RECTAL | 0 refills | Status: DC | PRN

## 2017-09-06 NOTE — Telephone Encounter (Signed)
Patient would like for Annette Atkins to send fenigan 25mg  suppositories in case patient has diarrhea on her trip.  She needs to have this done by Thurs.  [facility] [address]  Thank you

## 2017-09-30 ENCOUNTER — Ambulatory Visit: Payer: Self-pay | Admitting: Unknown Physician Specialty

## 2017-11-07 ENCOUNTER — Telehealth: Payer: Self-pay | Admitting: Physician Assistant

## 2017-11-07 NOTE — Telephone Encounter (Signed)
Patient approached front desk today asking for new refill of Percocet and records to be sent to another clinic. She had a documented doiscussion with Annette Atkins to establish with pain management and patient does not wish to, she wants to establish elsewhere. Annette MaxwellCheryl saw this patient 08/23/2017 and she has an Rx for percocet in the system to be filled tomorrow, 11/08/2017 it appears. I will not be filling this remaining any narcotic script for her. She has an appointment scheduled with Annette Maxwellheryl on 11/25/2017. I will also not be refilling any future medications for her, either benzodiazepine or narcotic. She may of course have her records per the protocol of this clinic.

## 2017-11-25 ENCOUNTER — Ambulatory Visit: Payer: Self-pay | Attending: Unknown Physician Specialty

## 2017-11-25 ENCOUNTER — Encounter: Payer: Self-pay | Admitting: Unknown Physician Specialty

## 2017-11-25 ENCOUNTER — Ambulatory Visit (INDEPENDENT_AMBULATORY_CARE_PROVIDER_SITE_OTHER): Payer: Self-pay | Admitting: Unknown Physician Specialty

## 2017-11-25 VITALS — BP 127/85 | HR 102 | Temp 98.7°F | Wt 94.5 lb

## 2017-11-25 DIAGNOSIS — G43909 Migraine, unspecified, not intractable, without status migrainosus: Secondary | ICD-10-CM

## 2017-11-25 DIAGNOSIS — G894 Chronic pain syndrome: Secondary | ICD-10-CM

## 2017-11-25 DIAGNOSIS — R110 Nausea: Secondary | ICD-10-CM

## 2017-11-25 DIAGNOSIS — F419 Anxiety disorder, unspecified: Secondary | ICD-10-CM

## 2017-11-25 DIAGNOSIS — R112 Nausea with vomiting, unspecified: Secondary | ICD-10-CM

## 2017-11-25 MED ORDER — OXYCODONE-ACETAMINOPHEN 5-325 MG PO TABS: 1.0000 | ORAL_TABLET | ORAL | 0 refills | Status: DC | PRN

## 2017-11-25 MED ORDER — OXYCODONE-ACETAMINOPHEN 5-325 MG PO TABS: 1.0000 | ORAL_TABLET | Freq: Two times a day (BID) | ORAL | 0 refills | Status: DC

## 2017-11-25 MED ORDER — CLONAZEPAM 0.5 MG PO TABS: ORAL_TABLET | ORAL | 2 refills | Status: DC

## 2017-11-25 MED ORDER — OXYCODONE-ACETAMINOPHEN 5-325 MG PO TABS: 1.0000 | ORAL_TABLET | Freq: Two times a day (BID) | ORAL | 0 refills | Status: DC | PRN

## 2017-11-25 MED ORDER — ONDANSETRON 4 MG PO TBDP: 4.0000 mg | ORAL_TABLET | Freq: Three times a day (TID) | ORAL | 2 refills | Status: DC | PRN

## 2017-11-25 NOTE — Progress Notes (Signed)
BP 127/85 (BP Location: Left Arm, Patient Position: Sitting, Cuff Size: Small)   Pulse (!) 102   Temp 98.7 F (37.1 C)   Wt 94 lb 8 oz (42.9 kg)   LMP  (LMP Unknown)   SpO2 97%   BMI 18.46 kg/m    Subjective:    Patient ID: Annette Atkins, female    DOB: 08-06-1965, 52 y.o.   MRN: [id number]  HPI: Annette Atkins is a 52 y.o. female  No chief complaint on file.  Chronic pain Pt has been using opioid therapy for chronic and long term pain. Last time I saw her, she refused to go to the pain clinic and agrees to taper off of opioid medications.  Pt states she has broken her pills in 1/2 and taking 1 - 1 1/2 per day.    Migraine Currently has a migraine.  Suffering from vertigo, blurred vision, and unable to drive.  She is unable to eat at this time and has lost 10 pounds.  Feels something is "really wrong" with my head.  Pt states last year she stood up and hit the back of her head.  Ever since then she hasn't done well.    Currently taking antibiotics for a tooth infection.    Relevant past medical, surgical, family and social history reviewed and updated as indicated. Interim medical history since our last visit reviewed. Allergies and medications reviewed and updated.  Review of Systems  Constitutional: Positive for chills and fatigue.  HENT: Negative.   Eyes: Negative.   Respiratory: Negative.   Cardiovascular: Negative.   Gastrointestinal: Negative.   Skin: Negative.   Neurological: Positive for dizziness, weakness, light-headedness and headaches.  Psychiatric/Behavioral: Negative.     Per HPI unless specifically indicated above     Objective:    BP 127/85 (BP Location: Left Arm, Patient Position: Sitting, Cuff Size: Small)   Pulse (!) 102   Temp 98.7 F (37.1 C)   Wt 94 lb 8 oz (42.9 kg)   LMP  (LMP Unknown)   SpO2 97%   BMI 18.46 kg/m   Wt Readings from Last 3 Encounters:  11/25/17 94 lb 8 oz (42.9 kg)  08/23/17 104 lb 3.2 oz (47.3 kg)    07/05/17 102 lb 14.4 oz (46.7 kg)    Physical Exam  Constitutional: She is oriented to person, place, and time. She appears well-developed and well-nourished. No distress.  HENT:  Head: Normocephalic and atraumatic.  Eyes: Conjunctivae and lids are normal. Right eye exhibits no discharge. Left eye exhibits no discharge. No scleral icterus.  Neck: Normal range of motion. Neck supple. No JVD present. Carotid bruit is not present.  Cardiovascular: Normal rate, regular rhythm and normal heart sounds.  Pulmonary/Chest: Effort normal and breath sounds normal.  Abdominal: Normal appearance. There is no splenomegaly or hepatomegaly.  Musculoskeletal: Normal range of motion.  Neurological: She is alert and oriented to person, place, and time.  Skin: Skin is warm, dry and intact. No rash noted. No pallor.  Psychiatric: She has a normal mood and affect. Her behavior is normal. Judgment and thought content normal.   No orthostatic changes.    Results for orders placed or performed in visit on 07/05/17  Urine drugs of abuse scrn w alc, routine (Ref Lab)  Result Value Ref Range   Amphetamines, Urine Negative Cutoff=1000 ng/mL   Barbiturate Quant, Ur Negative Cutoff=300 ng/mL   Benzodiazepine Quant, Ur Negative Cutoff=300 ng/mL   Cannabinoid Quant, Ur See Final  Results Cutoff=50 ng/mL   Cocaine (Metab.) Negative Cutoff=300 ng/mL   Opiate Quant, Ur Negative Cutoff=300 ng/mL   PCP Quant, Ur Negative Cutoff=25 ng/mL   Methadone Screen, Urine Negative Cutoff=300 ng/mL   Propoxyphene Negative Cutoff=300 ng/mL   Ethanol, Urine Negative Cutoff=0.020 %  Panel [id number]  Result Value Ref Range   Cannabinoid GC/MS, Ur Positive (A) Cutoff=50   CARBOXY THC GC/MS CONF 37 Cutoff=10 ng/mL      Assessment & Plan:   Problem List Items Addressed This Visit      Unprioritized   Anxiety    OK to continue Clonazepam as she is tapering of Oxycodone.  Filled #28 with 2 refills      Relevant Orders    Ambulatory referral to Neurology   Chronic pain    Pt agrees to tapering her pain medications, see last note.  Will rx 3 months of percocet that she can take one/day.  Pt understands this is the last dose of this particular medication from this office.  3 rx's given with do not fill dates until today, 12/22/17, 01/21/2018      Relevant Medications   oxyCODONE-acetaminophen (PERCOCET/ROXICET) 5-325 MG tablet   oxyCODONE-acetaminophen (ROXICET) 5-325 MG tablet   oxyCODONE-acetaminophen (PERCOCET/ROXICET) 5-325 MG tablet   clonazePAM (KLONOPIN) 0.5 MG tablet   Other Relevant Orders   Ambulatory referral to Neurology   Migraine    Complicated by possible post concussion syndrome.  Refer to neurology      Relevant Medications   oxyCODONE-acetaminophen (PERCOCET/ROXICET) 5-325 MG tablet   oxyCODONE-acetaminophen (ROXICET) 5-325 MG tablet   oxyCODONE-acetaminophen (PERCOCET/ROXICET) 5-325 MG tablet   clonazePAM (KLONOPIN) 0.5 MG tablet    Other Visit Diagnoses    Nausea    -  Primary   With vomiting.  10 pound weight loss since last seen.  This is significant on her small frame.     Nausea and vomiting, intractability of vomiting not specified, unspecified vomiting type       Seems to be dehydrated.  Asked to bring her to the ER   Intractable vomiting with nausea, unspecified vomiting type       Relevant Orders   CT Head Wo Contrast     Discussed with pt and husband about condition.  Husband feels she can eat and drink as long as not stressed.  Refusing ER treatment for now.  Head CT ordered due to headache accompanied by dizzyness and blurred vision.   Rx given for Zofran  Refusing labs.  Will go to the ER for worseing symptoms.    Follow up plan: Return in about 4 weeks (around 12/23/2017).

## 2017-11-25 NOTE — Assessment & Plan Note (Addendum)
Complicated by possible post concussion syndrome.  Refer to neurology

## 2017-11-25 NOTE — Assessment & Plan Note (Addendum)
OK to continue Clonazepam as she is tapering of Oxycodone.  Filled #28 with 2 refills

## 2017-11-25 NOTE — Assessment & Plan Note (Addendum)
Pt agrees to tapering her pain medications, see last note.  Will rx 3 months of percocet that she can take one/day.  Pt understands this is the last dose of this particular medication from this office.  3 rx's given with do not fill dates until today, 12/22/17, 01/21/2018

## 2017-12-22 ENCOUNTER — Telehealth: Payer: Self-pay | Admitting: Unknown Physician Specialty

## 2017-12-22 NOTE — Telephone Encounter (Signed)
Copied from CRM [id number]. Topic: General - Other >> Dec 22, 2017  2:14 PM Leafy Ro wrote: Reason for CRM: Pt had to quit her job on Friday 12-16-17 and needs to increase her anxiety medication clonazepam . Pt would like to take more clonazepam due to her chronic pain. Pt has an appt on 12/30/17. [facility] in [place]

## 2017-12-22 NOTE — Telephone Encounter (Signed)
She would have to come in for appointment for this, as I am new to practice and taking Annette Atkins's spot.  I do not know this patient and prefer not to increase a medication dose without seeing patient and discussing.  On review of chart she is also on opioid for chronic pain, there are major risks with benzo/opioid combo, would prefer not to increase her current dose without further discussion of risks with her.  Thank you.:)

## 2017-12-23 NOTE — Telephone Encounter (Signed)
Patient has an appointment on 10/18 and plans to wait until that appointment to discuss the increase.  Her migraines has gotten increasingly worse and feels the medication will have to be increased.  Just FYI

## 2017-12-30 ENCOUNTER — Ambulatory Visit (INDEPENDENT_AMBULATORY_CARE_PROVIDER_SITE_OTHER): Payer: Self-pay | Admitting: Unknown Physician Specialty

## 2017-12-30 ENCOUNTER — Encounter: Payer: Self-pay | Admitting: Unknown Physician Specialty

## 2017-12-30 DIAGNOSIS — G894 Chronic pain syndrome: Secondary | ICD-10-CM

## 2017-12-30 DIAGNOSIS — G43909 Migraine, unspecified, not intractable, without status migrainosus: Secondary | ICD-10-CM

## 2017-12-30 DIAGNOSIS — R200 Anesthesia of skin: Secondary | ICD-10-CM

## 2017-12-30 DIAGNOSIS — F419 Anxiety disorder, unspecified: Secondary | ICD-10-CM

## 2017-12-30 NOTE — Progress Notes (Signed)
BP 105/63 (BP Location: Left Arm, Patient Position: Sitting, Cuff Size: Small)   Pulse 83   Temp 97.7 F (36.5 C)   Wt 99 lb 8 oz (45.1 kg)   LMP  (LMP Unknown)   SpO2 99%   BMI 19.43 kg/m    Subjective:    Patient ID: Annette Atkins, female    DOB: May 13, 1965, 52 y.o.   MRN: [id number]  HPI: Annette Atkins is a 52 y.o. female  Chief Complaint  Patient presents with  . Migraine    Went to ED d/t severity of migraines  . Anxiety   Pt is here to f/u on opioid taper and migraine headaches.    Migraine HA:  Patient c/o increasing frequency of migraine HA beginning every a.m. And subsiding to moderate HA later in day. Never heard about neurology appt though entered last visit.  States she went to the ER with a negative CT.  Nausea and vomiting when the headache is bad.  Worse when eating the wrong things  Pain management Pain interfering with ADLs, unable to work d/t pain.  States she stopped her Percocet abruptly not realizing she had 2 more rx's of taking 1 per day.  Refusing appt with the pain clinic    Per HPI unless specifically indicated above  Relevant past medical, surgical, family and social history reviewed and updated as indicated. Interim medical history since our last visit reviewed. Allergies and medications reviewed and updated.  Review of Systems  Constitutional: Negative for unexpected weight change.  Respiratory: Negative.   Cardiovascular: Negative.   Neurological:       Patient also reporting numbness, pins and needles in right hand. Wearing wrist splints at night without releif.   Psychiatric/Behavioral:       She would like to have an increase in Clonazepam       Objective:    BP 105/63 (BP Location: Left Arm, Patient Position: Sitting, Cuff Size: Small)   Pulse 83   Temp 97.7 F (36.5 C)   Wt 99 lb 8 oz (45.1 kg)   LMP  (LMP Unknown)   SpO2 99%   BMI 19.43 kg/m   Wt Readings from Last 3 Encounters:  12/30/17 99 lb 8 oz (45.1  kg)  11/25/17 94 lb 8 oz (42.9 kg)  08/23/17 104 lb 3.2 oz (47.3 kg)    Physical Exam  Constitutional: She is oriented to person, place, and time. She appears well-developed and well-nourished. No distress.  HENT:  Head: Normocephalic and atraumatic.  Eyes: Conjunctivae and lids are normal. Right eye exhibits no discharge. Left eye exhibits no discharge. No scleral icterus.  Neck: Normal range of motion. Neck supple. No JVD present. Carotid bruit is not present.  Cardiovascular: Normal rate, regular rhythm and normal heart sounds.  Abdominal: Normal appearance. There is no splenomegaly or hepatomegaly.  Musculoskeletal: Normal range of motion.  Neurological: She is alert and oriented to person, place, and time.  Skin: Skin is warm, dry and intact. No rash noted. No pallor.  Psychiatric: She has a normal mood and affect. Her behavior is normal. Judgment and thought content normal.    Results for orders placed or performed in visit on 07/05/17  Urine drugs of abuse scrn w alc, routine (Ref Lab)  Result Value Ref Range   Amphetamines, Urine Negative Cutoff=1000 ng/mL   Barbiturate Quant, Ur Negative Cutoff=300 ng/mL   Benzodiazepine Quant, Ur Negative Cutoff=300 ng/mL   Cannabinoid Quant, Ur See Final Results Cutoff=50  ng/mL   Cocaine (Metab.) Negative Cutoff=300 ng/mL   Opiate Quant, Ur Negative Cutoff=300 ng/mL   PCP Quant, Ur Negative Cutoff=25 ng/mL   Methadone Screen, Urine Negative Cutoff=300 ng/mL   Propoxyphene Negative Cutoff=300 ng/mL   Ethanol, Urine Negative Cutoff=0.020 %  Panel [id number]  Result Value Ref Range   Cannabinoid GC/MS, Ur Positive (A) Cutoff=50   CARBOXY THC GC/MS CONF 37 Cutoff=10 ng/mL      Assessment & Plan:   Problem List Items Addressed This Visit      Unprioritized   Anxiety    Discussed we cannot increase benzodiazipine at this time.  Consider once opioid taper is complete      Chronic pain    Opioid taper.  Pt is to complete her taper after  rx on 11/10.  She will continue her taper.  Headaches likely related to abrupt discontinuation rather than planned taper.  No more opioid rx's after the "do not fill" rx on 11/10 from this office      Hand numbness    Probable nerve impingment.  Discuss during neuro appt      Migraine    Poor control.  Will check on neurology referral          Follow up plan: Return in about 3 months (around 04/01/2018).

## 2017-12-30 NOTE — Assessment & Plan Note (Signed)
Discussed we cannot increase benzodiazipine at this time.  Consider once opioid taper is complete

## 2017-12-30 NOTE — Assessment & Plan Note (Signed)
Opioid taper.  Pt is to complete her taper after rx on 11/10.  She will continue her taper.  Headaches likely related to abrupt discontinuation rather than planned taper.  No more opioid rx's after the "do not fill" rx on 11/10 from this office

## 2017-12-30 NOTE — Assessment & Plan Note (Signed)
Poor control.  Will check on neurology referral

## 2017-12-30 NOTE — Assessment & Plan Note (Signed)
Probable nerve impingment.  Discuss during neuro appt

## 2018-02-15 ENCOUNTER — Other Ambulatory Visit: Payer: Self-pay

## 2018-02-15 ENCOUNTER — Ambulatory Visit (INDEPENDENT_AMBULATORY_CARE_PROVIDER_SITE_OTHER): Payer: Self-pay | Admitting: Nurse Practitioner

## 2018-02-15 ENCOUNTER — Encounter: Payer: Self-pay | Admitting: Nurse Practitioner

## 2018-02-15 VITALS — BP 105/72 | HR 69 | Temp 98.0°F | Ht 60.0 in | Wt 102.0 lb

## 2018-02-15 DIAGNOSIS — F419 Anxiety disorder, unspecified: Secondary | ICD-10-CM

## 2018-02-15 DIAGNOSIS — G894 Chronic pain syndrome: Secondary | ICD-10-CM

## 2018-02-15 MED ORDER — CLONAZEPAM 0.5 MG PO TABS: ORAL_TABLET | ORAL | 0 refills | Status: DC

## 2018-02-15 NOTE — Progress Notes (Signed)
BP 105/72   Pulse 69   Temp 98 F (36.7 C) (Oral)   Ht 5' (1.524 m)   Wt 102 lb (46.3 kg)   LMP  (LMP Unknown)   SpO2 98%   BMI 19.92 kg/m    Subjective:    Patient ID: Annette Atkins, female    DOB: [birth year], 52 y.o.   MRN: [id number]  HPI: Annette Atkins is a 52 y.o. female presents for anxiety and chronic pain f/u  Chief Complaint  Patient presents with  . Anxiety    clonazepam refill   ANXIETY/STRESS She reports she is currently taking Klonopin 0.5MG  twice a day.  She is no longer taking Oxycodone, per her report.  On review of last visit she was made aware that no more refills on Oxycodone would be placed and she had been tapering off medication; she had wanted to increase benzo to BID dosing at last visit but it was discussed with her that she would first need to taper off Oxycodone.  She reports she has no longer been taking Oxycodone.  Is agreeable to have urine drug screen done today.  We discussed that if urine drug screen was negative for opioid use would increase Klonopin to BID dosing and she would need to be seen every three months for further refills.  Discussed at length with patient risks/benefits of benzo use. Pt is aware of risks of benzo medication use to include increased sedation, respiratory suppression, falls, extrapyramidal movements,  dependence and cardiovascular events.  Pt would like to continue treatment as benefit determined to outweigh risk.  She reports now that she is not taking Oxycodone any longer her pain is "severe" and she can not function like she once did, that taking the benzo twice a day allows her to function and work without taking pain medication.  She was made aware that if urine drug screen returns positive then provider would not increase benzo dose to BID and it would remain QDAY dosing. Duration: uncontrolled Anxious mood: yes  Excessive worrying: yes Irritability: yes  Sweating: no Nausea:  no Palpitations:no Hyperventilation: no Panic attacks: no Agoraphobia: no  Obscessions/compulsions: no Depressed mood: yes Depression screen [facility] 2[facility] 07/05/2017 05/02/2017 12/19/2015 06/02/2015  Decreased Interest 0 0 2 0  Down, Depressed, Hopeless 0 0 1 0  PHQ - 2 Score 0 0 3 0  Altered sleeping 0 0 3 -  Tired, decreased energy 0 0 0 -  Change in appetite 0 0 0 -  Feeling bad or failure about yourself  0 0 0 -  Trouble concentrating 0 0 0 -  Moving slowly or fidgety/restless 0 0 0 -  Suicidal thoughts 0 0 0 -  PHQ-9 Score 0 0 6 -  Difficult doing work/chores - - Somewhat difficult -   Anhedonia: no Weight changes: no Insomnia: yes hard to stay asleep  Hypersomnia: no Fatigue/loss of energy: no Feelings of worthlessness: no Feelings of guilt: no Impaired concentration/indecisiveness: no Suicidal ideations: no  Crying spells: no Recent Stressors/Life Changes: yes, stressors d/t pain   Relationship problems: no   Family stress: no     Financial stress: no    Job stress: no    Recent death/loss: no  GAD 7 : Generalized Anxiety Score 02/15/2018  Nervous, Anxious, on Edge 3  Control/stop worrying 3  Worry too much - different things 3  Trouble relaxing 3  Restless 3  Easily annoyed or irritable 3  Afraid - awful might happen 3  Total GAD 7 Score 21  Anxiety Difficulty Not difficult at all     Relevant past medical, surgical, family and social history reviewed and updated as indicated. Interim medical history since our last visit reviewed. Allergies and medications reviewed and updated.  Review of Systems  Constitutional: Negative for activity change, appetite change, diaphoresis, fatigue and fever.  Respiratory: Negative for cough, chest tightness and shortness of breath.   Cardiovascular: Negative for chest pain, palpitations and leg swelling.  Gastrointestinal: Negative for abdominal distention, abdominal pain, constipation, diarrhea, nausea and vomiting.  Skin:  Negative.   Neurological: Negative for dizziness, syncope, weakness, light-headedness, numbness and headaches.  Psychiatric/Behavioral: Positive for decreased concentration and sleep disturbance. Negative for behavioral problems and confusion. The patient is nervous/anxious.     Per HPI unless specifically indicated above     Objective:    BP 105/72   Pulse 69   Temp 98 F (36.7 C) (Oral)   Ht 5' (1.524 m)   Wt 102 lb (46.3 kg)   LMP  (LMP Unknown)   SpO2 98%   BMI 19.92 kg/m   Wt Readings from Last 3 Encounters:  02/15/18 102 lb (46.3 kg)  12/30/17 99 lb 8 oz (45.1 kg)  11/25/17 94 lb 8 oz (42.9 kg)    Physical Exam  Constitutional: She is oriented to person, place, and time. She appears well-developed and well-nourished.  HENT:  Head: Normocephalic.  Eyes: Pupils are equal, round, and reactive to light. Conjunctivae and EOM are normal. Right eye exhibits no discharge. Left eye exhibits no discharge.  Neck: Normal range of motion. Neck supple. No JVD present. Carotid bruit is not present. No thyromegaly present.  Cardiovascular: Normal rate, regular rhythm and normal heart sounds.  Pulmonary/Chest: Effort normal and breath sounds normal.  Abdominal: Soft. Bowel sounds are normal.  Lymphadenopathy:    She has no cervical adenopathy.  Neurological: She is alert and oriented to person, place, and time.  Skin: Skin is warm and dry.  Psychiatric: She has a normal mood and affect. Her behavior is normal. Judgment and thought content normal.  Nursing note and vitals reviewed.   Results for orders placed or performed in visit on 07/05/17  Urine drugs of abuse scrn w alc, routine (Ref Lab)  Result Value Ref Range   Amphetamines, Urine Negative Cutoff=1000 ng/mL   Barbiturate Quant, Ur Negative Cutoff=300 ng/mL   Benzodiazepine Quant, Ur Negative Cutoff=300 ng/mL   Cannabinoid Quant, Ur See Final Results Cutoff=50 ng/mL   Cocaine (Metab.) Negative Cutoff=300 ng/mL   Opiate  Quant, Ur Negative Cutoff=300 ng/mL   PCP Quant, Ur Negative Cutoff=25 ng/mL   Methadone Screen, Urine Negative Cutoff=300 ng/mL   Propoxyphene Negative Cutoff=300 ng/mL   Ethanol, Urine Negative Cutoff=0.020 %  Panel [id number]  Result Value Ref Range   Cannabinoid GC/MS, Ur Positive (A) Cutoff=50   CARBOXY THC GC/MS CONF 37 Cutoff=10 ng/mL      Assessment & Plan:   Problem List Items Addressed This Visit      Other   Chronic pain    Urine drug screen today to ensure taper was completed.  No further opioid refills.  Discussed pain clinic visit with patient, she refuses referral at this time.      Anxiety - Primary    Refer to HPI for complete discussion of POC.  Urine drug screen today.  If negative for opioid will increase Klonopin to 0.5 MG BID PRN, but if positive will continue QDAY PRN only dosing.  Lengthy discussion had with patient about risks of medication use and alternate regimens, she refuses to see psych or utilize alternative regimens.  She is aware will not continue to increase dose on medication, only BID PRN dosing.      Relevant Orders   Urine drugs of abuse scrn w alc, routine (Ref Lab)      Time: 25 minutes, >50% spent counseling on benzo and opioid use + risks/benefits/alternatives  Follow up plan: Return in about 3 months (around 05/17/2018) for Anxiety.

## 2018-02-15 NOTE — Assessment & Plan Note (Signed)
Urine drug screen today to ensure taper was completed.  No further opioid refills.  Discussed pain clinic visit with patient, she refuses referral at this time.

## 2018-02-15 NOTE — Patient Instructions (Signed)
Clonazepam tablets What is this medicine? CLONAZEPAM (kloe NA ze pam) is a benzodiazepine. It is used to treat certain types of seizures. It is also used to treat panic disorder. This medicine may be used for other purposes; ask your health care provider or pharmacist if you have questions. COMMON BRAND NAME(S): Ceberclon, Klonopin What should I tell my health care provider before I take this medicine? They need to know if you have any of these conditions: -an alcohol or drug abuse problem -bipolar disorder, depression, psychosis or other mental health condition -glaucoma -kidney or liver disease -lung or breathing disease -myasthenia gravis -Parkinson's disease -porphyria -seizures or a history of seizures -suicidal thoughts -an unusual or allergic reaction to clonazepam, other benzodiazepines, foods, dyes, or preservatives -pregnant or trying to get pregnant -breast-feeding How should I use this medicine? Take this medicine by mouth with a glass of water. Follow the directions on the prescription label. If it upsets your stomach, take it with food or milk. Take your medicine at regular intervals. Do not take it more often than directed. Do not stop taking or change the dose except on the advice of your doctor or health care professional. A special MedGuide will be given to you by the pharmacist with each prescription and refill. Be sure to read this information carefully each time. Talk to your pediatrician regarding the use of this medicine in children. Special care may be needed. Overdosage: If you think you have taken too much of this medicine contact a poison control center or emergency room at once. NOTE: This medicine is only for you. Do not share this medicine with others. What if I miss a dose? If you miss a dose, take it as soon as you can. If it is almost time for your next dose, take only that dose. Do not take double or extra doses. What may interact with this medicine? Do  not take this medication with any of the following medicines: -narcotic medicines for cough -sodium oxybate This medicine may also interact with the following medications: -alcohol -antihistamines for allergy, cough and cold -antiviral medicines for HIV or AIDS -certain medicines for anxiety or sleep -certain medicines for depression, like amitriptyline, fluoxetine, sertraline -certain medicines for fungal infections like ketoconazole and itraconazole -certain medicines for seizures like carbamazepine, phenobarbital, phenytoin, primidone -general anesthetics like halothane, isoflurane, methoxyflurane, propofol -local anesthetics like lidocaine, pramoxine, tetracaine -medicines that relax muscles for surgery -narcotic medicines for pain -phenothiazines like chlorpromazine, mesoridazine, prochlorperazine, thioridazine This list may not describe all possible interactions. Give your health care provider a list of all the medicines, herbs, non-prescription drugs, or dietary supplements you use. Also tell them if you smoke, drink alcohol, or use illegal drugs. Some items may interact with your medicine. What should I watch for while using this medicine? Tell your doctor or health care professional if your symptoms do not start to get better or if they get worse. Do not stop taking except on your doctor's advice. You may develop a severe reaction. Your doctor will tell you how much medicine to take. You may get drowsy or dizzy. Do not drive, use machinery, or do anything that needs mental alertness until you know how this medicine affects you. To reduce the risk of dizzy and fainting spells, do not stand or sit up quickly, especially if you are an older patient. Alcohol may increase dizziness and drowsiness. Avoid alcoholic drinks. If you are taking another medicine that also causes drowsiness, you may have more side   effects. Give your health care provider a list of all medicines you use. Your doctor  will tell you how much medicine to take. Do not take more medicine than directed. Call emergency for help if you have problems breathing or unusual sleepiness. The use of this medicine may increase the chance of suicidal thoughts or actions. Pay special attention to how you are responding while on this medicine. Any worsening of mood, or thoughts of suicide or dying should be reported to your health care professional right away. What side effects may I notice from receiving this medicine? Side effects that you should report to your doctor or health care professional as soon as possible: -allergic reactions like skin rash, itching or hives, swelling of the face, lips, or tongue -breathing problems -confusion -loss of balance or coordination -signs and symptoms of low blood pressure like dizziness; feeling faint or lightheaded, falls; unusually weak or tired -suicidal thoughts or mood changes Side effects that usually do not require medical attention (report to your doctor or health care professional if they continue or are bothersome): -dizziness -headache -tiredness -upset stomach This list may not describe all possible side effects. Call your doctor for medical advice about side effects. You may report side effects to FDA at 1-800-FDA-1088. Where should I keep my medicine? Keep out of the reach of children. This medicine can be abused. Keep your medicine in a safe place to protect it from theft. Do not share this medicine with anyone. Selling or giving away this medicine is dangerous and against the law. This medicine may cause accidental overdose and death if taken by other adults, children, or pets. Mix any unused medicine with a substance like cat litter or coffee grounds. Then throw the medicine away in a sealed container like a sealed bag or a coffee can with a lid. Do not use the medicine after the expiration date. Store at room temperature between 15 and 30 degrees C (59 and 86 degrees F).  Protect from light. Keep container tightly closed. NOTE: This sheet is a summary. It may not cover all possible information. If you have questions about this medicine, talk to your doctor, pharmacist, or health care provider.  2018 Elsevier/Gold Standard (2015-08-08 18:46:32)  

## 2018-02-15 NOTE — Assessment & Plan Note (Signed)
Refer to HPI for complete discussion of POC.  Urine drug screen today.  If negative for opioid will increase Klonopin to 0.5 MG BID PRN, but if positive will continue QDAY PRN only dosing.  Lengthy discussion had with patient about risks of medication use and alternate regimens, she refuses to see psych or utilize alternative regimens.  She is aware will not continue to increase dose on medication, only BID PRN dosing.

## 2018-02-16 ENCOUNTER — Other Ambulatory Visit: Payer: Self-pay | Admitting: Nurse Practitioner

## 2018-02-16 LAB — URINE DRUGS OF ABUSE SCREEN W ALC, ROUTINE (REF LAB)
Amphetamines, Urine: NEGATIVE ng/mL
Barbiturate Quant, Ur: NEGATIVE ng/mL
Benzodiazepine Quant, Ur: NEGATIVE ng/mL
CANNABINOID QUANT UR: NEGATIVE ng/mL
COCAINE (METAB.): NEGATIVE ng/mL
Ethanol, Urine: NEGATIVE %
METHADONE SCREEN, URINE: NEGATIVE ng/mL
OPIATE QUANT UR: NEGATIVE ng/mL
PCP Quant, Ur: NEGATIVE ng/mL
Propoxyphene: NEGATIVE ng/mL

## 2018-02-16 MED ORDER — CLONAZEPAM 0.5 MG PO TABS: 0.5000 mg | ORAL_TABLET | Freq: Two times a day (BID) | ORAL | 2 refills | Status: DC | PRN

## 2018-02-16 NOTE — Progress Notes (Signed)
During October she was made aware that no more refills on Oxycodone would be placed and she had been tapering off medication; she had wanted to increase benzo to BID dosing at October visit but it was discussed with her that she would first need to taper off Oxycodone.  She reports she has no longer been taking Oxycodone.  Her urine drug screen from 02/15/18 came back negative for opioid and her last opioid fill on review of system was beginning of November.  Discussed at length yesterday with patient risks/benefits of benzo use. Pt is aware of risks of benzo medication use to include increased sedation, respiratory suppression, falls, extrapyramidal movements,  dependence and cardiovascular events. Pt would like to continue treatment as benefit determined to outweigh risk.  She reports now that she is not taking Oxycodone any longer her pain is worse and she can not function like she once did, that taking the benzo twice a day allows her to function and work without taking pain medication.    Since urine drug screen has returned negative will increase benzo at this time to BID PRN dosing.  Patient is aware that she will need to see provider every 3 months for refills and education on medication, if she does not make these visits no refills will be placed.  She will no longer get opioid refills in office per our discussion and she is aware of this.  Had at length discussion with her about pain clinic visits, which she refuses to attend.  Will monitor benzo use closely and encourage GDR's.  No further increases in dose unless patient willing to see psychiatry.  Controlled substance.  Checked data base and most recent refill was for 4 Clonazepam pills written by this provider on 02/15/18, this was to provide medication until drug screen returned.

## 2018-03-16 ENCOUNTER — Other Ambulatory Visit: Payer: Self-pay | Admitting: Unknown Physician Specialty

## 2018-03-16 NOTE — Telephone Encounter (Signed)
Requested medication (s) are due for refill today: Yes  Requested medication (s) are on the active medication list: Yes  Last refill:  07/18/17  Future visit scheduled: Yes  Notes to clinic:  See request.    Requested Prescriptions  Pending Prescriptions Disp Refills   QUEtiapine (SEROQUEL) 50 MG tablet [Pharmacy Med Name: QUETIAPINE FUMARATE 50 MG TAB] 60 tablet 0    Sig: Take 2 tablets (100 mg total) by mouth at bedtime.     Not Delegated - Psychiatry:  Antipsychotics - Second Generation (Atypical) - quetiapine Failed - 03/16/2018  9:37 AM      Failed - This refill cannot be delegated      Failed - ALT in normal range and within 180 days    ALT  Date Value Ref Range Status  04/23/2016 16 14 - 54 U/L Final   SGPT (ALT)  Date Value Ref Range Status  05/24/2013 24 12 - 78 U/L Final         Failed - AST in normal range and within 180 days    AST  Date Value Ref Range Status  04/23/2016 27 15 - 41 U/L Final   SGOT(AST)  Date Value Ref Range Status  05/24/2013 33 15 - 37 Unit/L Final         Passed - Last BP in normal range    BP Readings from Last 1 Encounters:  02/15/18 105/72         Passed - Valid encounter within last 6 months    Recent Outpatient Visits          4 weeks ago Anxiety   [facility] [place], [place] T, NP   2 months ago Chronic pain syndrome   [facility] Gabriel Cirri, NP   3 months ago Nausea   [facility] Gabriel Cirri, NP   6 months ago Chronic pain syndrome   [facility] Gabriel Cirri, NP   8 months ago Pain management contract signed   [facility] Gabriel Cirri, NP      Future Appointments            In 2 months Cannady, Dorie Rank, NP [facility], [facility]

## 2018-04-04 IMAGING — DX DG CHEST 1V PORT
1 series · 1 of 1 positions shown · non-contrast
Comparison: None.

CLINICAL DATA: Chest pain.

EXAM:
PORTABLE CHEST 1 VIEW

[chest ap]
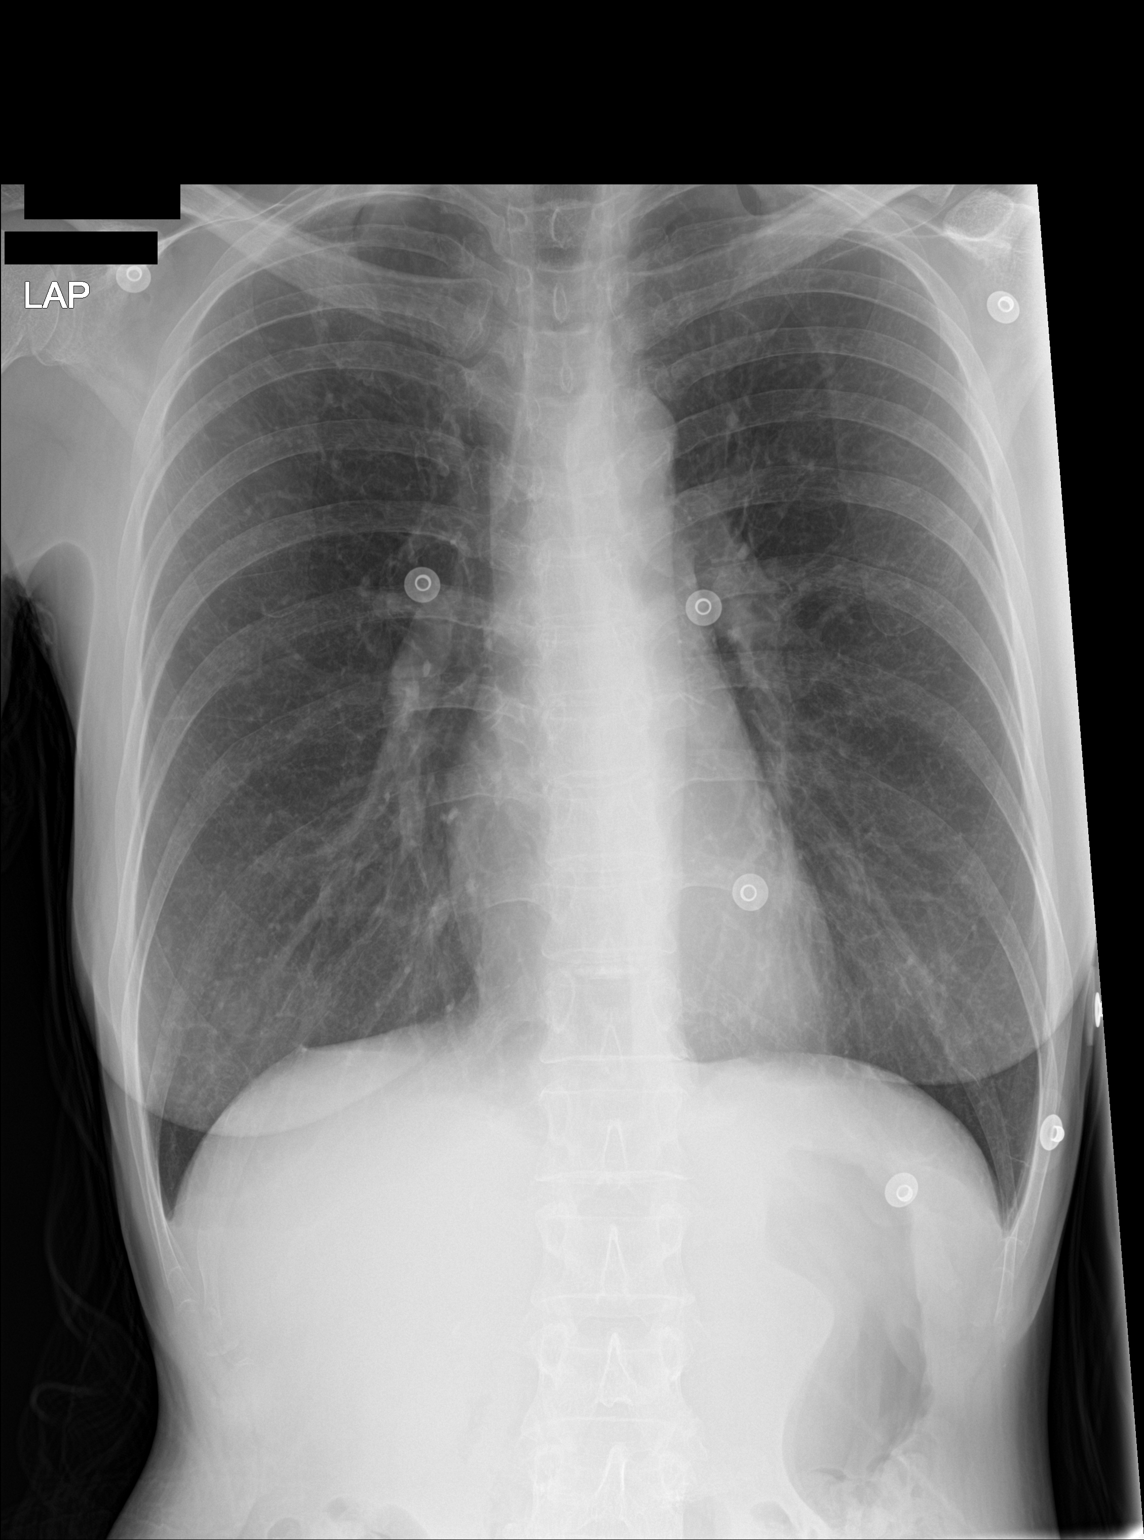

[1 of 1 positions shown; findings below may reference images not displayed]

FINDINGS: The heart size and mediastinal contours are within normal limits.
Both lungs are clear. The visualized skeletal structures are
unremarkable.
IMPRESSION: Normal chest.

## 2018-04-12 ENCOUNTER — Other Ambulatory Visit: Payer: Self-pay | Admitting: Nurse Practitioner

## 2018-04-12 NOTE — Telephone Encounter (Signed)
Requested medication (s) are due for refill today:  yes  Requested medication (s) are on the active medication list:  yes  Future visit scheduled:  yes  Last Refill: 03/16/18; # 60; no refills  Requested Prescriptions  Pending Prescriptions Disp Refills   QUEtiapine (SEROQUEL) 50 MG tablet [Pharmacy Med Name: QUETIAPINE FUMARATE 50 MG TAB] 60 tablet 0    Sig: Take 2 tablets (100 mg total) by mouth at bedtime.     Not Delegated - Psychiatry:  Antipsychotics - Second Generation (Atypical) - quetiapine Failed - 04/12/2018  2:26 PM      Failed - This refill cannot be delegated      Failed - ALT in normal range and within 180 days    ALT  Date Value Ref Range Status  04/23/2016 16 14 - 54 U/L Final   SGPT (ALT)  Date Value Ref Range Status  05/24/2013 24 12 - 78 U/L Final         Failed - AST in normal range and within 180 days    AST  Date Value Ref Range Status  04/23/2016 27 15 - 41 U/L Final   SGOT(AST)  Date Value Ref Range Status  05/24/2013 33 15 - 37 Unit/L Final         Passed - Last BP in normal range    BP Readings from Last 1 Encounters:  02/15/18 105/72         Passed - Valid encounter within last 6 months    Recent Outpatient Visits          1 month ago Anxiety   [facility] [place], [place] T, NP   3 months ago Chronic pain syndrome   [facility] Gabriel Cirri, NP   4 months ago Nausea   [facility] Gabriel Cirri, NP   7 months ago Chronic pain syndrome   [facility] Gabriel Cirri, NP   9 months ago Pain management contract signed   [facility] Gabriel Cirri, NP      Future Appointments            In 1 month Cannady, Dorie Rank, NP [facility], [facility]

## 2018-04-12 NOTE — Telephone Encounter (Signed)
Refill for Seroquel approved.  For further refills needs follow-up scheduled.

## 2018-04-12 NOTE — Telephone Encounter (Signed)
Sent it in

## 2018-04-28 ENCOUNTER — Other Ambulatory Visit: Payer: Self-pay | Admitting: Unknown Physician Specialty

## 2018-04-28 NOTE — Telephone Encounter (Signed)
Requested Prescriptions  Pending Prescriptions Disp Refills  . SUMAtriptan (IMITREX) 100 MG tablet [Pharmacy Med Name: SUMATRIPTAN SUCC 100 MG TABLET] 9 tablet 0    Sig: Take 1 tablet (100 mg total) by mouth once for 1 dose. May repeat in 2 hours if headache persists or recurs.     Neurology:  Migraine Therapy - Triptan Passed - 04/28/2018 10:10 AM      Passed - Last BP in normal range    BP Readings from Last 1 Encounters:  02/15/18 105/72         Passed - Valid encounter within last 12 months    Recent Outpatient Visits          2 months ago Anxiety   [facility] [place], [place] T, NP   3 months ago Chronic pain syndrome   [facility] Gabriel Cirri, NP   5 months ago Nausea   [facility] Gabriel Cirri, NP   8 months ago Chronic pain syndrome   [facility] Gabriel Cirri, NP   9 months ago Pain management contract signed   [facility] Gabriel Cirri, NP      Future Appointments            In 2 weeks Cannady, Dorie Rank, NP [facility], [facility]

## 2018-05-17 ENCOUNTER — Encounter: Payer: Self-pay | Admitting: Nurse Practitioner

## 2018-05-17 ENCOUNTER — Ambulatory Visit (INDEPENDENT_AMBULATORY_CARE_PROVIDER_SITE_OTHER): Payer: Self-pay | Admitting: Nurse Practitioner

## 2018-05-17 ENCOUNTER — Other Ambulatory Visit: Payer: Self-pay

## 2018-05-17 VITALS — BP 114/72 | HR 74 | Temp 97.9°F | Ht 60.0 in | Wt 106.0 lb

## 2018-05-17 DIAGNOSIS — F410 Panic disorder [episodic paroxysmal anxiety] without agoraphobia: Secondary | ICD-10-CM

## 2018-05-17 DIAGNOSIS — F411 Generalized anxiety disorder: Secondary | ICD-10-CM

## 2018-05-17 DIAGNOSIS — F5101 Primary insomnia: Secondary | ICD-10-CM

## 2018-05-17 MED ORDER — CLONAZEPAM 0.5 MG PO TABS: 0.5000 mg | ORAL_TABLET | Freq: Two times a day (BID) | ORAL | 2 refills | Status: DC | PRN

## 2018-05-17 MED ORDER — QUETIAPINE FUMARATE 50 MG PO TABS: 100.0000 mg | ORAL_TABLET | Freq: Every day | ORAL | 1 refills | Status: DC

## 2018-05-17 MED ORDER — SUMATRIPTAN SUCCINATE 100 MG PO TABS: 100.0000 mg | ORAL_TABLET | Freq: Once | ORAL | 0 refills | Status: DC

## 2018-05-17 NOTE — Assessment & Plan Note (Signed)
Chronic, ongoing.  Continue Seroquel.

## 2018-05-17 NOTE — Assessment & Plan Note (Signed)
Chronic, she is starting to verbalize interest in tapering off Seroquel and Klonopin in future.  Expressed interest in alternate options.  At this time wishes to continue Klonopin.  Refill sent.  Return in 3 months.

## 2018-05-17 NOTE — Patient Instructions (Addendum)
Duloxetine delayed-release capsules What is this medicine? DULOXETINE (doo LOX e teen) is used to treat depression, anxiety, and different types of chronic pain. This medicine may be used for other purposes; ask your health care provider or pharmacist if you have questions. COMMON BRAND NAME(S): Cymbalta, Irenka What should I tell my health care provider before I take this medicine? They need to know if you have any of these conditions: -bipolar disorder or a family history of bipolar disorder -glaucoma -kidney disease -liver disease -suicidal thoughts or a previous suicide attempt -taken medicines called MAOIs like Carbex, Eldepryl, Marplan, Nardil, and Parnate within 14 days -an unusual reaction to duloxetine, other medicines, foods, dyes, or preservatives -pregnant or trying to get pregnant -breast-feeding How should I use this medicine? Take this medicine by mouth with a glass of water. Follow the directions on the prescription label. Do not cut, crush or chew this medicine. You can take this medicine with or without food. Take your medicine at regular intervals. Do not take your medicine more often than directed. Do not stop taking this medicine suddenly except upon the advice of your doctor. Stopping this medicine too quickly may cause serious side effects or your condition may worsen. A special MedGuide will be given to you by the pharmacist with each prescription and refill. Be sure to read this information carefully each time. Talk to your pediatrician regarding the use of this medicine in children. While this drug may be prescribed for children as young as 7 years of age for selected conditions, precautions do apply. Overdosage: If you think you have taken too much of this medicine contact a poison control center or emergency room at once. NOTE: This medicine is only for you. Do not share this medicine with others. What if I miss a dose? If you miss a dose, take it as soon as you  can. If it is almost time for your next dose, take only that dose. Do not take double or extra doses. What may interact with this medicine? Do not take this medicine with any of the following medications: -desvenlafaxine -levomilnacipran -linezolid -MAOIs like Carbex, Eldepryl, Marplan, Nardil, and Parnate -methylene blue (injected into a vein) -milnacipran -thioridazine -venlafaxine This medicine may also interact with the following medications: -alcohol -amphetamines -aspirin and aspirin-like medicines -certain antibiotics like ciprofloxacin and enoxacin -certain medicines for blood pressure, heart disease, irregular heart beat -certain medicines for depression, anxiety, or psychotic disturbances -certain medicines for migraine headache like almotriptan, eletriptan, frovatriptan, naratriptan, rizatriptan, sumatriptan, zolmitriptan -certain medicines that treat or prevent blood clots like warfarin, enoxaparin, and dalteparin -cimetidine -fentanyl -lithium -NSAIDS, medicines for pain and inflammation, like ibuprofen or naproxen -phentermine -procarbazine -rasagiline -sibutramine -St. John's wort -theophylline -tramadol -tryptophan This list may not describe all possible interactions. Give your health care provider a list of all the medicines, herbs, non-prescription drugs, or dietary supplements you use. Also tell them if you smoke, drink alcohol, or use illegal drugs. Some items may interact with your medicine. What should I watch for while using this medicine? Tell your doctor if your symptoms do not get better or if they get worse. Visit your doctor or health care professional for regular checks on your progress. Because it may take several weeks to see the full effects of this medicine, it is important to continue your treatment as prescribed by your doctor. Patients and their families should watch out for new or worsening thoughts of suicide or depression. Also watch out for  sudden changes in   feelings such as feeling anxious, agitated, panicky, irritable, hostile, aggressive, impulsive, severely restless, overly excited and hyperactive, or not being able to sleep. If this happens, especially at the beginning of treatment or after a change in dose, call your health care professional. You may get drowsy or dizzy. Do not drive, use machinery, or do anything that needs mental alertness until you know how this medicine affects you. Do not stand or sit up quickly, especially if you are an older patient. This reduces the risk of dizzy or fainting spells. Alcohol may interfere with the effect of this medicine. Avoid alcoholic drinks. This medicine can cause an increase in blood pressure. This medicine can also cause a sudden drop in your blood pressure, which may make you feel faint and increase the chance of a fall. These effects are most common when you first start the medicine or when the dose is increased, or during use of other medicines that can cause a sudden drop in blood pressure. Check with your doctor for instructions on monitoring your blood pressure while taking this medicine. Your mouth may get dry. Chewing sugarless gum or sucking hard candy, and drinking plenty of water may help. Contact your doctor if the problem does not go away or is severe. What side effects may I notice from receiving this medicine? Side effects that you should report to your doctor or health care professional as soon as possible: -allergic reactions like skin rash, itching or hives, swelling of the face, lips, or tongue -anxious -breathing problems -confusion -changes in vision -chest pain -confusion -elevated mood, decreased need for sleep, racing thoughts, impulsive behavior -eye pain -fast, irregular heartbeat -feeling faint or lightheaded, falls -feeling agitated, angry, or irritable -hallucination, loss of contact with reality -high blood pressure -loss of balance or  coordination -palpitations -redness, blistering, peeling or loosening of the skin, including inside the mouth -restlessness, pacing, inability to keep still -seizures -stiff muscles -suicidal thoughts or other mood changes -trouble passing urine or change in the amount of urine -trouble sleeping -unusual bleeding or bruising -unusually weak or tired -vomiting -yellowing of the eyes or skin Side effects that usually do not require medical attention (report to your doctor or health care professional if they continue or are bothersome): -change in sex drive or performance -change in appetite or weight -constipation -dizziness -dry mouth -headache -increased sweating -nausea -tired This list may not describe all possible side effects. Call your doctor for medical advice about side effects. You may report side effects to FDA at 1-800-FDA-1088. Where should I keep my medicine? Keep out of the reach of children. Store at room temperature between 20 and 25 degrees C (68 to 77 degrees F). Throw away any unused medicine after the expiration date. NOTE: This sheet is a summary. It may not cover all possible information. If you have questions about this medicine, talk to your doctor, pharmacist, or health care provider.  2019 Elsevier/Gold Standard (2015-07-31 18:16:03)  Trazodone tablets What is this medicine? TRAZODONE (TRAZ oh done) is used to treat depression. This medicine may be used for other purposes; ask your health care provider or pharmacist if you have questions. COMMON BRAND NAME(S): Desyrel What should I tell my health care provider before I take this medicine? They need to know if you have any of these conditions: -attempted suicide or thinking about it -bipolar disorder -bleeding problems -glaucoma -heart disease, or previous heart attack -irregular heart beat -kidney or liver disease -low levels of sodium in the blood -  an unusual or allergic reaction to trazodone,  other medicines, foods, dyes or preservatives -pregnant or trying to get pregnant -breast-feeding How should I use this medicine? Take this medicine by mouth with a glass of water. Follow the directions on the prescription label. Take this medicine shortly after a meal or a light snack. Take your medicine at regular intervals. Do not take your medicine more often than directed. Do not stop taking this medicine suddenly except upon the advice of your doctor. Stopping this medicine too quickly may cause serious side effects or your condition may worsen. A special MedGuide will be given to you by the pharmacist with each prescription and refill. Be sure to read this information carefully each time. Talk to your pediatrician regarding the use of this medicine in children. Special care may be needed. Overdosage: If you think you have taken too much of this medicine contact a poison control center or emergency room at once. NOTE: This medicine is only for you. Do not share this medicine with others. What if I miss a dose? If you miss a dose, take it as soon as you can. If it is almost time for your next dose, take only that dose. Do not take double or extra doses. What may interact with this medicine? Do not take this medicine with any of the following medications: -certain medicines for fungal infections like fluconazole, itraconazole, ketoconazole, posaconazole, voriconazole -cisapride -dofetilide -dronedarone -linezolid -MAOIs like Carbex, Eldepryl, Marplan, Nardil, and Parnate -mesoridazine -methylene blue (injected into a vein) -pimozide -saquinavir -thioridazine This medicine may also interact with the following medications: -alcohol -antiviral medicines for HIV or AIDS -aspirin and aspirin-like medicines -barbiturates like phenobarbital -certain medicines for blood pressure, heart disease, irregular heart beat -certain medicines for depression, anxiety, or psychotic  disturbances -certain medicines for migraine headache like almotriptan, eletriptan, frovatriptan, naratriptan, rizatriptan, sumatriptan, zolmitriptan -certain medicines for seizures like carbamazepine and phenytoin -certain medicines for sleep -certain medicines that treat or prevent blood clots like dalteparin, enoxaparin, warfarin -digoxin -fentanyl -lithium -NSAIDS, medicines for pain and inflammation, like ibuprofen or naproxen -other medicines that prolong the QT interval (cause an abnormal heart rhythm) -rasagiline -supplements like St. John's wort, kava kava, valerian -tramadol -tryptophan This list may not describe all possible interactions. Give your health care provider a list of all the medicines, herbs, non-prescription drugs, or dietary supplements you use. Also tell them if you smoke, drink alcohol, or use illegal drugs. Some items may interact with your medicine. What should I watch for while using this medicine? Tell your doctor if your symptoms do not get better or if they get worse. Visit your doctor or health care professional for regular checks on your progress. Because it may take several weeks to see the full effects of this medicine, it is important to continue your treatment as prescribed by your doctor. Patients and their families should watch out for new or worsening thoughts of suicide or depression. Also watch out for sudden changes in feelings such as feeling anxious, agitated, panicky, irritable, hostile, aggressive, impulsive, severely restless, overly excited and hyperactive, or not being able to sleep. If this happens, especially at the beginning of treatment or after a change in dose, call your health care professional. You may get drowsy or dizzy. Do not drive, use machinery, or do anything that needs mental alertness until you know how this medicine affects you. Do not stand or sit up quickly, especially if you are an older patient. This reduces the risk of   dizzy  or fainting spells. Alcohol may interfere with the effect of this medicine. Avoid alcoholic drinks. This medicine may cause dry eyes and blurred vision. If you wear contact lenses you may feel some discomfort. Lubricating drops may help. See your eye doctor if the problem does not go away or is severe. Your mouth may get dry. Chewing sugarless gum, sucking hard candy and drinking plenty of water may help. Contact your doctor if the problem does not go away or is severe. What side effects may I notice from receiving this medicine? Side effects that you should report to your doctor or health care professional as soon as possible: -allergic reactions like skin rash, itching or hives, swelling of the face, lips, or tongue -elevated mood, decreased need for sleep, racing thoughts, impulsive behavior -confusion -fast, irregular heartbeat -feeling faint or lightheaded, falls -feeling agitated, angry, or irritable -loss of balance or coordination -painful or prolonged erections -restlessness, pacing, inability to keep still -suicidal thoughts or other mood changes -tremors -trouble sleeping -seizures -unusual bleeding or bruising Side effects that usually do not require medical attention (report to your doctor or health care professional if they continue or are bothersome): -change in sex drive or performance -change in appetite or weight -constipation -headache -muscle aches or pains -nausea This list may not describe all possible side effects. Call your doctor for medical advice about side effects. You may report side effects to FDA at 1-800-FDA-1088. Where should I keep my medicine? Keep out of the reach of children. Store at room temperature between 15 and 30 degrees C (59 to 86 degrees F). Protect from light. Keep container tightly closed. Throw away any unused medicine after the expiration date. NOTE: This sheet is a summary. It may not cover all possible information. If you have  questions about this medicine, talk to your doctor, pharmacist, or health care provider.  2019 Elsevier/Gold Standard (2017-05-10 17:51:24)   

## 2018-05-17 NOTE — Progress Notes (Signed)
BP 114/72   Pulse 74   Temp 97.9 F (36.6 C) (Oral)   Ht 5' (1.524 m)   Wt 106 lb (48.1 kg)   LMP  (LMP Unknown)   SpO2 98%   BMI 20.70 kg/m    Subjective:    Patient ID: Annette Atkins, female    DOB: [birth year], 53 y.o.   MRN: [id number]  HPI: Annette Atkins is a 53 y.o. female  Chief Complaint  Patient presents with  . Anxiety    f/u   ANXIETY/STRESS Currently takes Seroquel and Klonopin.  In past she has refused to see psychiatry or utilize other medications, such as SSRI or Duloxetine (which would benefit both pain and mood).  Pt is aware of risks of psychoactive medication use to include increased sedation, respiratory suppression, falls, extrapyramidal movements,  dependence and cardiovascular events.  Pt would like to continue treatment as benefit determined to outweigh risk.  At last visit her Klonopin was increased to 0.5 MG BID AS NEEDED ONLY.  Patient no longer taking Oxycodone for pain and her drug screen was negative 02/15/18.  She refuses referral to pain clinic for her chronic pain issues.  On discussion today she began to verbalize interest in tapering off Seroquel in Klonopin in upcoming months, she does report concerns with this as she has missed doses of both before and reports "it is awful, the anxiety and then without the Seroquel I don't sleep as well".  She was interested in other options, discussed medications like Duloxetine, SSRIs, or Trazodone with her.  Provided written education to her on Trazodone and Duloxetine.   Duration:stable Anxious mood: yes  Excessive worrying: no Irritability: yes  Sweating: no Nausea: no Palpitations:no Hyperventilation: no Panic attacks: yes Agoraphobia: no  Obscessions/compulsions: no Depressed mood: yes Depression screen [facility] 2/9 05/17/2018 05/17/2018 07/05/2017 05/02/2017 12/19/2015  Decreased Interest 3 3 0 0 2  Down, Depressed, Hopeless 3 3 0 0 1  PHQ - 2 Score 6 6 0 0 3  Altered sleeping 3 3 0 0 3  Tired,  decreased energy 3 3 0 0 0  Change in appetite 3 3 0 0 0  Feeling bad or failure about yourself  0 0 0 0 0  Trouble concentrating 3 3 0 0 0  Moving slowly or fidgety/restless 3 3 0 0 0  Suicidal thoughts 0 0 0 0 0  PHQ-9 Score 21 21 0 0 6  Difficult doing work/chores - - - - Somewhat difficult   Anhedonia: no Weight changes: no Insomnia: yes hard to stay asleep (this is controlled with Seroquel, but if misses a dose she can not sleep) Hypersomnia: no Fatigue/loss of energy: no Feelings of worthlessness: no Feelings of guilt: no Impaired concentration/indecisiveness: yes Suicidal ideations: no  Crying spells: no Recent Stressors/Life Changes: no   Relationship problems: no   Family stress: no     Financial stress: no    Job stress: no    Recent death/loss: no  GAD 7 : Generalized Anxiety Score 05/17/2018 02/15/2018  Nervous, Anxious, on Edge 3 3  Control/stop worrying 0 3  Worry too much - different things 0 3  Trouble relaxing 3 3  Restless 3 3  Easily annoyed or irritable 3 3  Afraid - awful might happen 0 3  Total GAD 7 Score 12 21  Anxiety Difficulty Very difficult Not difficult at all   Relevant past medical, surgical, family and social history reviewed and updated as indicated. Interim medical  history since our last visit reviewed. Allergies and medications reviewed and updated.  Review of Systems  Constitutional: Negative for activity change, appetite change, diaphoresis, fatigue and fever.  Respiratory: Negative for cough, chest tightness and shortness of breath.   Cardiovascular: Negative for chest pain, palpitations and leg swelling.  Gastrointestinal: Negative for abdominal distention, abdominal pain, constipation, diarrhea, nausea and vomiting.  Endocrine: Negative for cold intolerance, heat intolerance, polydipsia, polyphagia and polyuria.  Neurological: Negative for dizziness, syncope, weakness, light-headedness, numbness and headaches.    Psychiatric/Behavioral: Negative.     Per HPI unless specifically indicated above     Objective:    BP 114/72   Pulse 74   Temp 97.9 F (36.6 C) (Oral)   Ht 5' (1.524 m)   Wt 106 lb (48.1 kg)   LMP  (LMP Unknown)   SpO2 98%   BMI 20.70 kg/m   Wt Readings from Last 3 Encounters:  05/17/18 106 lb (48.1 kg)  02/15/18 102 lb (46.3 kg)  12/30/17 99 lb 8 oz (45.1 kg)    Physical Exam Vitals signs and nursing note reviewed.  Constitutional:      Appearance: She is well-developed.  HENT:     Head: Normocephalic.  Eyes:     General:        Right eye: No discharge.        Left eye: No discharge.     Conjunctiva/sclera: Conjunctivae normal.     Pupils: Pupils are equal, round, and reactive to light.  Neck:     Musculoskeletal: Normal range of motion and neck supple.     Thyroid: No thyromegaly.     Vascular: No carotid bruit or JVD.  Cardiovascular:     Rate and Rhythm: Normal rate and regular rhythm.     Heart sounds: Normal heart sounds. No murmur. No gallop.   Pulmonary:     Effort: Pulmonary effort is normal.     Breath sounds: Normal breath sounds.  Abdominal:     General: Bowel sounds are normal.     Palpations: Abdomen is soft.  Musculoskeletal:     Right lower leg: No edema.     Left lower leg: No edema.  Lymphadenopathy:     Cervical: No cervical adenopathy.  Skin:    General: Skin is warm and dry.  Neurological:     Mental Status: She is alert and oriented to person, place, and time.  Psychiatric:        Mood and Affect: Mood normal.        Behavior: Behavior normal.        Thought Content: Thought content normal.        Judgment: Judgment normal.     Results for orders placed or performed in visit on 02/15/18  Urine drugs of abuse scrn w alc, routine (Ref Lab)  Result Value Ref Range   Amphetamines, Urine Negative Cutoff=1000 ng/mL   Barbiturate Quant, Ur Negative Cutoff=300 ng/mL   Benzodiazepine Quant, Ur Negative Cutoff=300 ng/mL    Cannabinoid Quant, Ur Negative Cutoff=50 ng/mL   Cocaine (Metab.) Negative Cutoff=300 ng/mL   Opiate Quant, Ur Negative Cutoff=300 ng/mL   PCP Quant, Ur Negative Cutoff=25 ng/mL   Methadone Screen, Urine Negative Cutoff=300 ng/mL   Propoxyphene Negative Cutoff=300 ng/mL   Ethanol, Urine Negative Cutoff=0.020 %      Assessment & Plan:   Problem List Items Addressed This Visit      Other   Insomnia    Chronic, ongoing.  Continue Seroquel.  Generalized anxiety disorder with panic attacks    Chronic, she is starting to verbalize interest in tapering off Seroquel and Klonopin in future.  Expressed interest in alternate options.  At this time wishes to continue Klonopin.  Refill sent.  Return in 3 months.          Follow up plan: Return in about 3 months (around 08/17/2018) for Anxiety.

## 2018-06-07 ENCOUNTER — Encounter: Payer: Self-pay | Admitting: Emergency Medicine

## 2018-06-07 ENCOUNTER — Ambulatory Visit
Admission: EM | Admit: 2018-06-07 | Discharge: 2018-06-07 | Disposition: A | Discharge status: Home / Self Care | Payer: Self-pay | Attending: Family Medicine | Admitting: Family Medicine

## 2018-06-07 ENCOUNTER — Other Ambulatory Visit: Payer: Self-pay

## 2018-06-07 DIAGNOSIS — R21 Rash and other nonspecific skin eruption: Secondary | ICD-10-CM

## 2018-06-07 MED ORDER — PREDNISONE 10 MG PO TABS: ORAL_TABLET | ORAL | 0 refills | Status: DC

## 2018-06-07 MED ORDER — HYDROXYZINE HCL 25 MG PO TABS: 25.0000 mg | ORAL_TABLET | Freq: Three times a day (TID) | ORAL | 0 refills | Status: DC | PRN

## 2018-06-07 NOTE — ED Provider Notes (Signed)
[facility]    CSN: [id number] Arrival date & time: 06/07/18  3833   History   Chief Complaint Chief Complaint  Patient presents with  . Rash   HPI  53 year old female presents with rash.  Started yesterday. Diffuse rash. Raised, red. Very itchy. She reports associated pain. She states that she recently completed an antibiotic course of clindamycin for a dental problem.  Rash also affects the palms and soles.  No documented fever.  She has taken Benadryl without relief.  Seems to be worsening.  Severe.  No other associated symptoms.  No other complaints.  PMH, Surgical Hx, Family Hx, Social History reviewed and updated as below.  Past Medical History:  Diagnosis Date  . Allergy   . Anxiety   . Depression   . Fibromyalgia   . Migraine    Patient Active Problem List   Diagnosis Date Noted  . Hand numbness 12/30/2017  . Pain management contract signed 07/05/2017  . Generalized anxiety disorder with panic attacks 07/05/2017  . Migraine 06/02/2015  . Celiac disease 06/02/2015  . Insomnia 09/13/2014  . Chronic pain 09/13/2014   Past Surgical History:  Procedure Laterality Date  . APPENDECTOMY  Age 8  . CESAREAN SECTION  2000 and 2001  . LEFT OOPHORECTOMY Left 2004   OB History   No obstetric history on file.    Home Medications    Prior to Admission medications   Medication Sig Start Date End Date Taking? Authorizing Provider  clonazePAM (KLONOPIN) 0.5 MG tablet Take 1 tablet (0.5 mg total) by mouth 2 (two) times daily as needed for anxiety. 1 tab twice per day as needed for anxiety/panic attack 05/17/18  Yes Cannady, Jolene T, NP  eletriptan (RELPAX) 40 MG tablet Take 40 mg by mouth as needed for migraine or headache. May repeat in 2 hours if headache persists or recurs.   Yes [provider]  promethazine (PHENERGAN) 25 MG suppository Place 1 suppository (25 mg total) rectally every 6 (six) hours as needed for nausea or vomiting. 09/06/17  Yes  Johnson, Megan P, DO  QUEtiapine (SEROQUEL) 50 MG tablet Take 2 tablets (100 mg total) by mouth at bedtime. 05/17/18  Yes Cannady, Jolene T, NP  SUMAtriptan (IMITREX) 100 MG tablet Take 1 tablet (100 mg total) by mouth once for 1 dose. May repeat in 2 hours if headache persists or recurs. 05/17/18 06/07/18 Yes Cannady, Jolene T, NP  hydrOXYzine (ATARAX/VISTARIL) 25 MG tablet Take 1 tablet (25 mg total) by mouth every 8 (eight) hours as needed for itching. 06/07/18   Tommie Sams, DO  predniSONE (DELTASONE) 10 MG tablet 50 mg daily x 2 days, then 40 mg daily x 2 days, then 30 mg daily x 2 days, then 20 mg daily x 2 days, then 10 mg daily x 2 days. 06/07/18   Tommie Sams, DO    Family History Family History  Problem Relation Age of Onset  . Arthritis Mother   . Glaucoma Mother   . Thyroid disease Mother   . Heart disease Father   . Cancer Paternal Grandmother        pancreatic  . Mental illness Son     Social History Social History   Tobacco Use  . Smoking status: Current Every Day Smoker    Packs/day: 0.50    Types: Cigarettes  . Smokeless tobacco: Never Used  Substance Use Topics  . Alcohol use: Yes    Alcohol/week: 0.0 standard drinks  Comment: glass of wine on occasion  . Drug use: No     Allergies   Codeine and Vicodin [hydrocodone-acetaminophen]   Review of Systems Review of Systems  Constitutional: Negative.   Respiratory:       No current SOB.  Skin: Positive for rash.   Physical Exam Triage Vital Signs ED Triage Vitals  Enc Vitals Group     BP 06/07/18 0842 111/84     Pulse Rate 06/07/18 0842 81     Resp 06/07/18 0842 20     Temp 06/07/18 0842 98.5 F (36.9 C)     Temp Source 06/07/18 0842 Oral     SpO2 06/07/18 0842 100 %     Weight 06/07/18 0838 105 lb (47.6 kg)     Height 06/07/18 0838 5' (1.524 m)     Head Circumference --      Peak Flow --      Pain Score 06/07/18 0838 10     Pain Loc --      Pain Edu? --      Excl. in GC? --    Updated  Vital Signs BP 111/84 (BP Location: Left Arm)   Pulse 81   Temp 98.5 F (36.9 C) (Oral)   Resp 20   Ht 5' (1.524 m)   Wt 47.6 kg   LMP  (LMP Unknown)   SpO2 100%   BMI 20.51 kg/m   Visual Acuity Right Eye Distance:   Left Eye Distance:   Bilateral Distance:    Right Eye Near:   Left Eye Near:    Bilateral Near:     Physical Exam Vitals signs and nursing note reviewed.  Constitutional:      General: She is not in acute distress.    Appearance: Normal appearance.  HENT:     Head: Normocephalic and atraumatic.  Eyes:     General:        Right eye: No discharge.        Left eye: No discharge.     Conjunctiva/sclera: Conjunctivae normal.  Cardiovascular:     Rate and Rhythm: Normal rate and regular rhythm.  Pulmonary:     Effort: Pulmonary effort is normal.     Breath sounds: Normal breath sounds.  Skin:    Comments: Diffuse, raised erythematous rash.  Neurological:     Mental Status: She is alert.  Psychiatric:        Behavior: Behavior normal.     Comments: Anxious.    UC Treatments / Results  Labs (all labs ordered are listed, but only abnormal results are displayed) Labs Reviewed - No data to display  EKG None  Radiology No results found.  Procedures Procedures (including critical care time)  Medications Ordered in UC Medications - No data to display  Initial Impression / Assessment and Plan / UC Course  I have reviewed the triage vital signs and the nursing notes.  Pertinent labs & imaging results that were available during my care of the patient were reviewed by me and considered in my medical decision making (see chart for details).    53 year old female presents with rash. Likely allergic. Treating with prednisone and Atarax.  Final Clinical Impressions(s) / UC Diagnoses   Final diagnoses:  Rash     Discharge Instructions     Medications as prescribed.  If you fail to improve or worsen, [facility].  Take care  Dr.  Adriana Simasook    ED Prescriptions  Medication Sig Dispense Auth. Provider   hydrOXYzine (ATARAX/VISTARIL) 25 MG tablet Take 1 tablet (25 mg total) by mouth every 8 (eight) hours as needed for itching. 30 tablet Duglas Heier G, DO   predniSONE (DELTASONE) 10 MG tablet 50 mg daily x 2 days, then 40 mg daily x 2 days, then 30 mg daily x 2 days, then 20 mg daily x 2 days, then 10 mg daily x 2 days. 30 tablet Tommie Sams, DO     Controlled Substance Prescriptions [place] Controlled Substance Registry consulted? Not Applicable   Tommie Sams, [place] 06/07/18 3358

## 2018-06-07 NOTE — Discharge Instructions (Signed)
Medications as prescribed.  If you fail to improve or worsen, contact [facility] dermatology.  Take care  Dr. Adriana Simas

## 2018-06-07 NOTE — ED Triage Notes (Signed)
Pt c/o rash. Rash is located on her all over her body. She states that it hurts to walk on her feet and to bend her hands. Rash started yesterday but worse this morning. She has been taking an antibiotic but stopped it the day before the rash started.  She states that her face feels like it is tingling and burning from the inside out. She states every now and then she feels like she has shortness of breath. She has been taking benadryl.

## 2018-06-29 ENCOUNTER — Ambulatory Visit
Admission: EM | Admit: 2018-06-29 | Discharge: 2018-06-29 | Disposition: A | Discharge status: Home / Self Care | Payer: Self-pay | Attending: Family Medicine | Admitting: Family Medicine

## 2018-06-29 ENCOUNTER — Encounter: Payer: Self-pay | Admitting: Emergency Medicine

## 2018-06-29 ENCOUNTER — Ambulatory Visit (INDEPENDENT_AMBULATORY_CARE_PROVIDER_SITE_OTHER): Payer: Self-pay

## 2018-06-29 ENCOUNTER — Other Ambulatory Visit: Payer: Self-pay

## 2018-06-29 DIAGNOSIS — M79632 Pain in left forearm: Secondary | ICD-10-CM

## 2018-06-29 DIAGNOSIS — S52502A Unspecified fracture of the lower end of left radius, initial encounter for closed fracture: Secondary | ICD-10-CM

## 2018-06-29 DIAGNOSIS — M25532 Pain in left wrist: Secondary | ICD-10-CM

## 2018-06-29 DIAGNOSIS — W0110XA Fall on same level from slipping, tripping and stumbling with subsequent striking against unspecified object, initial encounter: Secondary | ICD-10-CM

## 2018-06-29 MED ORDER — OXYCODONE-ACETAMINOPHEN 5-325 MG PO TABS: 1.0000 | ORAL_TABLET | Freq: Three times a day (TID) | ORAL | 0 refills | Status: DC | PRN

## 2018-06-29 NOTE — ED Notes (Signed)
Wrist Splint applied to left wrist. Patient tolerated well. Sling also applied for comfort.

## 2018-06-29 NOTE — ED Provider Notes (Addendum)
[facility]    CSN: [id number] Arrival date & time: 06/29/18  1332  History   Chief Complaint Chief Complaint  Patient presents with  . Wrist Pain  . Arm Pain   HPI  53 year old female presents with the above complaints.  Patient states that she suffered a fall Monday night after tripping on her legs of a chair.  Patient states that she fell back onto a flexed left wrist.  Patient reports severe pain of her wrist and distal forearm.  Her pain is severe, 10/10 in severity.  Patient reports decreased range of motion in all planes.  Patient states that she is essentially unable to supinate her wrist and hand.  She reports left hand tingling.  She has been taking ibuprofen and Tylenol for pain without resolution.  Exacerbated by activity.  No relieving factors.  No other associated symptoms.  No other complaints.  PMH, Surgical Hx, Family Hx, Social History reviewed and updated as below.  Past Medical History:  Diagnosis Date  . Allergy   . Anxiety   . Depression   . Fibromyalgia   . Migraine     Patient Active Problem List   Diagnosis Date Noted  . Hand numbness 12/30/2017  . Pain management contract signed 07/05/2017  . Generalized anxiety disorder with panic attacks 07/05/2017  . Migraine 06/02/2015  . Celiac disease 06/02/2015  . Insomnia 09/13/2014  . Chronic pain 09/13/2014    Past Surgical History:  Procedure Laterality Date  . APPENDECTOMY  Age 53  . CESAREAN SECTION  2000 and 2001  . LEFT OOPHORECTOMY Left 2004    OB History   No obstetric history on file.      Home Medications    Prior to Admission medications   Medication Sig Start Date End Date Taking? Authorizing Provider  clonazePAM (KLONOPIN) 0.5 MG tablet Take 1 tablet (0.5 mg total) by mouth 2 (two) times daily as needed for anxiety. 1 tab twice per day as needed for anxiety/panic attack 05/17/18  Yes Cannady, Jolene T, NP  eletriptan (RELPAX) 40 MG tablet Take 40 mg by mouth as needed  for migraine or headache. May repeat in 2 hours if headache persists or recurs.   Yes [provider]  promethazine (PHENERGAN) 25 MG suppository Place 1 suppository (25 mg total) rectally every 6 (six) hours as needed for nausea or vomiting. 09/06/17  Yes Johnson, Megan P, DO  QUEtiapine (SEROQUEL) 50 MG tablet Take 2 tablets (100 mg total) by mouth at bedtime. 05/17/18  Yes Cannady, Jolene T, NP  oxyCODONE-acetaminophen (PERCOCET/ROXICET) 5-325 MG tablet Take 1 tablet by mouth every 8 (eight) hours as needed for severe pain. 06/29/18   Tommie Sams, DO  SUMAtriptan (IMITREX) 100 MG tablet Take 1 tablet (100 mg total) by mouth once for 1 dose. May repeat in 2 hours if headache persists or recurs. 05/17/18 06/07/18  Marjie Skiff, NP    Family History Family History  Problem Relation Age of Onset  . Arthritis Mother   . Glaucoma Mother   . Thyroid disease Mother   . Heart disease Father   . Cancer Paternal Grandmother        pancreatic  . Mental illness Son     Social History Social History   Tobacco Use  . Smoking status: Current Every Day Smoker    Packs/day: 0.50    Types: Cigarettes  . Smokeless tobacco: Never Used  Substance Use Topics  . Alcohol use: Yes  Alcohol/week: 0.0 standard drinks    Comment: glass of wine on occasion  . Drug use: No     Allergies   Codeine and Vicodin [hydrocodone-acetaminophen]   Review of Systems Review of Systems  Constitutional: Negative for fever.  Musculoskeletal:       Left wrist & forearm pain, swelling, decreased ROM.   Physical Exam Triage Vital Signs ED Triage Vitals  Enc Vitals Group     BP 06/29/18 1344 100/76     Pulse Rate 06/29/18 1344 80     Resp 06/29/18 1344 18     Temp 06/29/18 1344 98.2 F (36.8 C)     Temp Source 06/29/18 1344 Oral     SpO2 06/29/18 1344 100 %     Weight 06/29/18 1345 100 lb (45.4 kg)     Height 06/29/18 1345 5' (1.524 m)     Head Circumference --      Peak Flow --      Pain  Score 06/29/18 1344 10     Pain Loc --      Pain Edu? --      Excl. in GC? --    Updated Vital Signs BP 100/76 (BP Location: Right Arm)   Pulse 80   Temp 98.2 F (36.8 C) (Oral)   Resp 18   Ht 5' (1.524 m)   Wt 45.4 kg   LMP  (LMP Unknown)   SpO2 100%   BMI 19.53 kg/m   Visual Acuity Right Eye Distance:   Left Eye Distance:   Bilateral Distance:    Right Eye Near:   Left Eye Near:    Bilateral Near:     Physical Exam Vitals signs and nursing note reviewed.  Constitutional:      Comments: Appears in distress secondary to pain.  Nontoxic-appearing.  Thin.  HENT:     Head: Normocephalic and atraumatic.  Eyes:     General:        Right eye: No discharge.        Left eye: No discharge.     Conjunctiva/sclera: Conjunctivae normal.  Pulmonary:     Effort: Pulmonary effort is normal. No respiratory distress.  Musculoskeletal:     Comments: Left wrist & forearm -2+ radial pulse.  Sensation to touch and pain diminished per patient report.  Patient with exquisite tenderness over the wrist and distal forearm.  Exam very limited due to severe pain.  Skin:    General: Skin is warm.     Comments: Mild bruising noted on the volar aspect of the wrist/distal forearm.  Neurological:     Mental Status: She is alert.  Psychiatric:        Mood and Affect: Mood normal.        Behavior: Behavior normal.    UC Treatments / Results  Labs (all labs ordered are listed, but only abnormal results are displayed) Labs Reviewed - No data to display  EKG None  Radiology Dg Forearm Left  Result Date: 06/29/2018 CLINICAL DATA:  Pain following fall EXAM: LEFT FOREARM - 2 VIEW COMPARISON:  None. FINDINGS: Frontal and lateral views were obtained. There is a transversely oriented fracture of the distal radial metaphysis with alignment anatomic. No other fracture. No dislocation. Joint spaces appear normal. No erosive change. IMPRESSION: Nondisplaced fracture distal radial metaphysis. No other  fracture. No dislocation. No appreciable arthropathy. Electronically Signed   By: Bretta Bang III M.D.   On: 06/29/2018 14:13   Dg Wrist Complete Left  Result Date: 06/29/2018 CLINICAL DATA:  Pain following fall EXAM: LEFT WRIST - COMPLETE 3+ VIEW COMPARISON:  None. FINDINGS: Frontal, oblique, lateral, and ulnar deviation scaphoid images were obtained. There is a nondisplaced transversely oriented fracture of the distal radial metaphysis. No other fracture. No dislocation. Joint spaces appear normal. No erosive change. IMPRESSION: Nondisplaced transversely oriented fracture distal radial metaphysis. No other fracture. No dislocation. No evident arthropathy. These results will be called to the ordering clinician or representative by the Radiologist Assistant, and communication documented in the PACS or zVision Dashboard. Electronically Signed   By: Bretta BangWilliam  Woodruff III M.D.   On: 06/29/2018 14:14    Procedures Procedures (including critical care time)  Medications Ordered in UC Medications - No data to display  Initial Impression / Assessment and Plan / UC Course  I have reviewed the triage vital signs and the nursing notes.  Pertinent labs & imaging results that were available during my care of the patient were reviewed by me and considered in my medical decision making (see chart for details).    53 year old female presents with a nondisplaced transverse fracture of the distal radius.  Placed in sugar tong splint.  Oxycodone given for pain.  [place] controlled substance database reviewed today.  No concerns for abuse at this time. Advised to follow-up with orthopedics.  Information given.  Final Clinical Impressions(s) / UC Diagnoses   Final diagnoses:  Closed fracture of distal end of left radius, unspecified fracture morphology, initial encounter     Discharge Instructions     [facility] [phone] ) or [facility] [phone]) for an  appointment.  Wear splint.  Pain medication as directed.  Keep elevated.  Take care  Dr. Adriana Simasook     ED Prescriptions    Medication Sig Dispense Auth. Provider   oxyCODONE-acetaminophen (PERCOCET/ROXICET) 5-325 MG tablet Take 1 tablet by mouth every 8 (eight) hours as needed for severe pain. 15 tablet Tommie Samsook, Jacqueline Spofford G, DO     Controlled Substance Prescriptions [place] Controlled Substance Registry consulted? Yes, I have consulted the [place] Controlled Substances Registry for this patient, and feel the risk/benefit ratio today is favorable for proceeding with this prescription for a controlled substance.   Tommie SamsCook, Dana Debo G, DO 06/29/18 1436    Everlene Otherook, Zacharia Sowles G, DO 06/29/18 1437

## 2018-06-29 NOTE — ED Triage Notes (Signed)
Patient c/o falling Monday night on her wrist. She states she has been taking Ibuprofen and Tylenol for pain but nothing is working. She states the pain is getting worse and her fingers are numb.

## 2018-06-29 NOTE — Discharge Instructions (Signed)
Call [facility] clinic orthopedics [phone] ) or [facility] [phone]) for an appointment.  Wear splint.  Pain medication as directed.  Keep elevated.  Take care  Dr. Adriana Simas

## 2018-07-11 ENCOUNTER — Other Ambulatory Visit: Payer: Self-pay | Admitting: Nurse Practitioner

## 2018-08-10 ENCOUNTER — Encounter: Payer: Self-pay | Admitting: Nurse Practitioner

## 2018-08-10 ENCOUNTER — Other Ambulatory Visit: Payer: Self-pay

## 2018-08-10 ENCOUNTER — Ambulatory Visit (INDEPENDENT_AMBULATORY_CARE_PROVIDER_SITE_OTHER): Payer: Self-pay | Admitting: Nurse Practitioner

## 2018-08-10 DIAGNOSIS — F410 Panic disorder [episodic paroxysmal anxiety] without agoraphobia: Secondary | ICD-10-CM

## 2018-08-10 DIAGNOSIS — F5101 Primary insomnia: Secondary | ICD-10-CM

## 2018-08-10 DIAGNOSIS — F411 Generalized anxiety disorder: Secondary | ICD-10-CM

## 2018-08-10 MED ORDER — QUETIAPINE FUMARATE 50 MG PO TABS: 100.0000 mg | ORAL_TABLET | Freq: Every day | ORAL | 2 refills | Status: DC

## 2018-08-10 MED ORDER — CLONAZEPAM 0.5 MG PO TABS: 0.5000 mg | ORAL_TABLET | Freq: Two times a day (BID) | ORAL | 2 refills | Status: DC | PRN

## 2018-08-10 NOTE — Progress Notes (Signed)
Temp (!) 97.5 F (36.4 C)   Wt 105 lb (47.6 kg)   LMP  (LMP Unknown)   BMI 20.51 kg/m    Subjective:    Patient ID: Annette Atkins, female    DOB: March 17, 1965, 53 y.o.   MRN: [id number]  HPI: Annette Atkins is a 54 y.o. female  Chief Complaint  Patient presents with  . Anxiety    . This visit was completed via WebEx due to the restrictions of the COVID-19 pandemic. All issues as above were discussed and addressed. Physical exam was done as above through visual confirmation on WebEx. If it was felt that the patient should be evaluated in the office, they were directed there. The patient verbally consented to this visit. . Location of the patient: home . Location of the provider: home . Those involved with this call:  . Provider: Aura Dials, DNP . CMA: Tiffany Reel, CMA . Front Desk/Registration: Harriet Pho  . Time spent on call: 15 minutes with patient face to face via video conference. More than 50% of this time was spent in counseling and coordination of care. 10 minutes total spent in review of patient's record and preparation of their chart. I verified patient identity using two factors (patient name and date of birth). Patient consents verbally to being seen via telemedicine visit today.   ANXIETY/STRESS Currently takes Seroquel and Klonopin.  Has refused to see psychiatry in past or utilize other medications, such as SSRI or Duloxetine (which would benefit both pain and mood).  Pt is aware of risks of benzo medication use to include increased sedation, respiratory suppression, falls, extrapyramidal movements,  dependence and cardiovascular events.  Pt would like to continue treatment as benefit determined to outweigh risk. Current Klonopin order is 0.5 MG BID as needed only, reports less stress since selling her house.  She is taking it twice a day right now.  Patient tapered off Oxycodone for pain last year and her drug screen was negative 02/15/18.  She  refuses referral to pain clinic for her chronic pain issues.  At last visit she began to verbalize interest in tapering off Seroquel and Klonopin in upcoming months, she does report concerns with this as she has missed doses of both before and reports "it is awful, although reported concerns with this as she has missed doses in past and felt awful.  Last visit discussed options like Duloxetine, SSRIs, or Trazodone with her. At this time she wishes to continue current therapy, but she reports she may try cutting back over next 3 months on her Klonopin prior to our next visit.  She is interested in coming off Klonopin slowly and trying Duloxetine or alternate medication. Duration:controlled Anxious mood: yes Excessive worrying: no Irritability: no  Sweating: no Nausea: no Palpitations:no Hyperventilation: no Panic attacks: no Agoraphobia: no  Obscessions/compulsions: no Depressed mood: no Depression screen [facility] 2/9 08/10/2018 05/17/2018 05/17/2018 07/05/2017 05/02/2017  Decreased Interest 0 3 3 0 0  Down, Depressed, Hopeless 0 3 3 0 0  PHQ - 2 Score 0 6 6 0 0  Altered sleeping 0 3 3 0 0  Tired, decreased energy 0 3 3 0 0  Change in appetite - 3 3 0 0  Feeling bad or failure about yourself  0 0 0 0 0  Trouble concentrating 0 3 3 0 0  Moving slowly or fidgety/restless 0 3 3 0 0  Suicidal thoughts 0 0 0 0 0  PHQ-9 Score 0 21 21 0  0  Difficult doing work/chores Not difficult at all - - - -   Anhedonia: no Weight changes: no Insomnia: yes hard to fall asleep  Hypersomnia: no Fatigue/loss of energy: no Feelings of worthlessness: no Feelings of guilt: no Impaired concentration/indecisiveness: no Suicidal ideations: no  Crying spells: no Recent Stressors/Life Changes: no   Relationship problems: no   Family stress: no     Financial stress: no    Job stress: no    Recent death/loss: no  GAD 7 : Generalized Anxiety Score 08/10/2018 05/17/2018 02/15/2018  Nervous, Anxious, on Edge 0 3 3   Control/stop worrying 0 0 3  Worry too much - different things 0 0 3  Trouble relaxing 0 3 3  Restless 0 3 3  Easily annoyed or irritable 0 3 3  Afraid - awful might happen 0 0 3  Total GAD 7 Score 0 12 21  Anxiety Difficulty Not difficult at all Very difficult Not difficult at all     Relevant past medical, surgical, family and social history reviewed and updated as indicated. Interim medical history since our last visit reviewed. Allergies and medications reviewed and updated.  Review of Systems  Constitutional: Negative for activity change, appetite change, diaphoresis, fatigue and fever.  Respiratory: Negative for cough, chest tightness and shortness of breath.   Cardiovascular: Negative for chest pain, palpitations and leg swelling.  Gastrointestinal: Negative for abdominal distention, abdominal pain, constipation, diarrhea, nausea and vomiting.  Neurological: Negative for dizziness, syncope, weakness, light-headedness, numbness and headaches.  Psychiatric/Behavioral: The patient is nervous/anxious.     Per HPI unless specifically indicated above     Objective:    Temp (!) 97.5 F (36.4 C)   Wt 105 lb (47.6 kg)   LMP  (LMP Unknown)   BMI 20.51 kg/m   Wt Readings from Last 3 Encounters:  08/10/18 105 lb (47.6 kg)  06/29/18 100 lb (45.4 kg)  06/07/18 105 lb (47.6 kg)    Physical Exam Vitals signs and nursing note reviewed.  Constitutional:      General: She is awake. She is not in acute distress.    Appearance: She is well-developed. She is not ill-appearing.  HENT:     Head: Normocephalic.     Right Ear: Hearing normal.     Left Ear: Hearing normal.  Eyes:     General: Lids are normal.        Right eye: No discharge.        Left eye: No discharge.     Conjunctiva/sclera: Conjunctivae normal.  Neck:     Musculoskeletal: Normal range of motion.  Cardiovascular:     Comments: Unable to auscultate due to virtual exam only  Pulmonary:     Effort: Pulmonary  effort is normal. No accessory muscle usage or respiratory distress.     Comments: Unable to auscultate due to virtual exam only  Neurological:     Mental Status: She is alert and oriented to person, place, and time.  Psychiatric:        Attention and Perception: Attention normal.        Mood and Affect: Mood normal.        Behavior: Behavior normal. Behavior is cooperative.        Thought Content: Thought content normal.        Judgment: Judgment normal.     Results for orders placed or performed in visit on 02/15/18  Urine drugs of abuse scrn w alc, routine (Ref Lab)  Result Value Ref Range   Amphetamines, Urine Negative Cutoff=1000 ng/mL   Barbiturate Quant, Ur Negative Cutoff=300 ng/mL   Benzodiazepine Quant, Ur Negative Cutoff=300 ng/mL   Cannabinoid Quant, Ur Negative Cutoff=50 ng/mL   Cocaine (Metab.) Negative Cutoff=300 ng/mL   Opiate Quant, Ur Negative Cutoff=300 ng/mL   PCP Quant, Ur Negative Cutoff=25 ng/mL   Methadone Screen, Urine Negative Cutoff=300 ng/mL   Propoxyphene Negative Cutoff=300 ng/mL   Ethanol, Urine Negative Cutoff=0.020 %      Assessment & Plan:   Problem List Items Addressed This Visit      Other   Insomnia    Chronic, ongoing.  Continue current medication regimen.        Generalized anxiety disorder with panic attacks    Chronic, continues to verbalize interest in tapering off Seroquel and Klonopin, but wishes to do this herself.  Continue current medication regimen at this time.  Refills sent.  Return in 3 months.         Controlled substance.  Checked data base and no other refills of controlled substances noted, last Klonopin refill 07/11/2018.  I discussed the assessment and treatment plan with the patient. The patient was provided an opportunity to ask questions and all were answered. The patient agreed with the plan and demonstrated an understanding of the instructions.   The patient was advised to call back or seek an in-person  evaluation if the symptoms worsen or if the condition fails to improve as anticipated.   I provided 15 minutes of time during this encounter.  Follow up plan: Return in about 3 months (around 11/10/2018) for Mood and Insomnia.

## 2018-08-10 NOTE — Assessment & Plan Note (Signed)
Chronic, ongoing.  Continue current medication regimen.   

## 2018-08-10 NOTE — Assessment & Plan Note (Signed)
Chronic, continues to verbalize interest in tapering off Seroquel and Klonopin, but wishes to do this herself.  Continue current medication regimen at this time.  Refills sent.  Return in 3 months.

## 2018-08-10 NOTE — Patient Instructions (Signed)

## 2018-08-17 ENCOUNTER — Ambulatory Visit: Payer: Self-pay | Admitting: Nurse Practitioner

## 2018-09-27 ENCOUNTER — Telehealth: Payer: Self-pay | Admitting: Nurse Practitioner

## 2018-09-27 NOTE — Telephone Encounter (Signed)
Spoke to patient on phone.  She discussed that with rash in March she had taken Augmentin and Clindmycin, so was not sure which caused rash.  Her whole family is allergic to PCN.  Her dentist recently placed her on Clinda for oral surgery and she was concerned.  Advised her that is concerned to talk to her dentist and discuss alternate options, Levofloxacin or Doxycycline.  She is going to try a dose of Clindamycin and if notices any changes she will call dentist right away.

## 2018-09-27 NOTE — Telephone Encounter (Signed)
Copied from [id number]. Topic: General - Other >> Sep 27, 2018  2:56 PM Keene Breath wrote: Reason for CRM: Patient would like to speak with the nurse or doctor about an antibiotic that she thinks she might be allergic to.  She is about to have oral surgery.  Please call patient today if possible at [phone]

## 2018-10-19 HISTORY — PX: DENTAL SURGERY: SHX609

## 2018-10-26 ENCOUNTER — Other Ambulatory Visit: Payer: Self-pay

## 2018-10-26 ENCOUNTER — Emergency Department
Admission: EM | Admit: 2018-10-26 | Discharge: 2018-10-26 | Disposition: A | Discharge status: Home / Self Care | Payer: Self-pay | Attending: Emergency Medicine | Admitting: Emergency Medicine

## 2018-10-26 ENCOUNTER — Encounter: Payer: Self-pay | Admitting: Emergency Medicine

## 2018-10-26 DIAGNOSIS — Z5321 Procedure and treatment not carried out due to patient leaving prior to being seen by health care provider: Secondary | ICD-10-CM | POA: Insufficient documentation

## 2018-10-26 DIAGNOSIS — F419 Anxiety disorder, unspecified: Secondary | ICD-10-CM | POA: Insufficient documentation

## 2018-10-26 LAB — COMPREHENSIVE METABOLIC PANEL
ALT: 16 U/L (ref 0–44)
AST: 21 U/L (ref 15–41)
Albumin: 4.8 g/dL (ref 3.5–5.0)
Alkaline Phosphatase: 83 U/L (ref 38–126)
Anion gap: 9 (ref 5–15)
BUN: 11 mg/dL (ref 6–20)
CO2: 23 mmol/L (ref 22–32)
Calcium: 10 mg/dL (ref 8.9–10.3)
Chloride: 106 mmol/L (ref 98–111)
Creatinine, Ser: 0.93 mg/dL (ref 0.44–1.00)
GFR calc Af Amer: >60 mL/min (ref >60)
GFR calc non Af Amer: >60 mL/min (ref >60)
Glucose, Bld: 100 mg/dL — ABNORMAL HIGH (ref 70–99)
Potassium: 3.9 mmol/L (ref 3.5–5.1)
Sodium: 138 mmol/L (ref 135–145)
Total Bilirubin: 0.5 mg/dL (ref 0.3–1.2)
Total Protein: 8 g/dL (ref 6.5–8.1)

## 2018-10-26 LAB — CBC WITH DIFFERENTIAL/PLATELET
Abs Immature Granulocytes: 0.02 10*3/uL (ref 0.00–0.07)
Basophils Absolute: 0.1 10*3/uL (ref 0.0–0.1)
Basophils Relative: 1 %
Eosinophils Absolute: 0 10*3/uL (ref 0.0–0.5)
Eosinophils Relative: 1 %
HCT: 36.5 % (ref 36.0–46.0)
Hemoglobin: 12.5 g/dL (ref 12.0–15.0)
Immature Granulocytes: 0 %
Lymphocytes Relative: 25 %
Lymphs Abs: 2.1 10*3/uL (ref 0.7–4.0)
MCH: 30.5 pg (ref 26.0–34.0)
MCHC: 34.2 g/dL (ref 30.0–36.0)
MCV: 89 fL (ref 80.0–100.0)
Monocytes Absolute: 0.7 10*3/uL (ref 0.1–1.0)
Monocytes Relative: 8 %
Neutro Abs: 5.4 10*3/uL (ref 1.7–7.7)
Neutrophils Relative %: 65 %
Platelets: 323 10*3/uL (ref 150–400)
RBC: 4.1 MIL/uL (ref 3.87–5.11)
RDW: 12.2 % (ref 11.5–15.5)
WBC: 8.2 10*3/uL (ref 4.0–10.5)
nRBC: 0 % (ref 0.0–0.2)

## 2018-10-26 LAB — TROPONIN I (HIGH SENSITIVITY): Troponin I (High Sensitivity): 3 ng/L (ref <18)

## 2018-10-26 NOTE — ED Triage Notes (Addendum)
Pt to triage via w/c with no distress noted, mask in place; pt reports that her heart was pounding for an hr with some intermittent "pain in my heart"; now with nausea and "not feeling well"; st that she has had a very stressful day (apparently son has been IVC and is here); st hx anxiety and "feels like my whole body is vibrating on the inside"

## 2018-10-27 ENCOUNTER — Telehealth: Payer: Self-pay | Admitting: Emergency Medicine

## 2018-10-27 NOTE — Telephone Encounter (Signed)
Called patient due to lwot to inquire about condition and follow up plans. Says she feels better today.  She plans to contact her pcp and make appt.  I told her that her labs were available for review as well.

## 2018-11-13 ENCOUNTER — Encounter: Payer: Self-pay | Admitting: Nurse Practitioner

## 2018-11-13 ENCOUNTER — Ambulatory Visit (INDEPENDENT_AMBULATORY_CARE_PROVIDER_SITE_OTHER): Payer: Self-pay | Admitting: Nurse Practitioner

## 2018-11-13 ENCOUNTER — Other Ambulatory Visit: Payer: Self-pay

## 2018-11-13 DIAGNOSIS — F410 Panic disorder [episodic paroxysmal anxiety] without agoraphobia: Secondary | ICD-10-CM

## 2018-11-13 DIAGNOSIS — F411 Generalized anxiety disorder: Secondary | ICD-10-CM

## 2018-11-13 MED ORDER — CLONAZEPAM 0.5 MG PO TABS: 0.5000 mg | ORAL_TABLET | Freq: Two times a day (BID) | ORAL | 2 refills | Status: DC | PRN

## 2018-11-13 MED ORDER — PROMETHAZINE HCL 25 MG RE SUPP: 25.0000 mg | Freq: Four times a day (QID) | RECTAL | 0 refills | Status: AC | PRN

## 2018-11-13 MED ORDER — QUETIAPINE FUMARATE 50 MG PO TABS: 100.0000 mg | ORAL_TABLET | Freq: Every day | ORAL | 2 refills | Status: DC

## 2018-11-13 MED ORDER — SUMATRIPTAN SUCCINATE 100 MG PO TABS: 100.0000 mg | ORAL_TABLET | Freq: Once | ORAL | 0 refills | Status: AC

## 2018-11-13 MED ORDER — ELETRIPTAN HYDROBROMIDE 40 MG PO TABS: 40.0000 mg | ORAL_TABLET | ORAL | 0 refills | Status: AC | PRN

## 2018-11-13 NOTE — Patient Instructions (Signed)

## 2018-11-13 NOTE — Assessment & Plan Note (Signed)
Chronic, continues to verbalize interest in tapering off Seroquel and Klonopin, but wishes to do this herself.  Continue current medication regimen at this time.  Denies SI/HI.  Refills sent.  Return in 3 months.

## 2018-11-13 NOTE — Progress Notes (Addendum)
LMP  (LMP Unknown)    Subjective:    Patient ID: Annette Atkins, female    DOB: March 26, 1965, 53 y.o.   MRN: [id number]  HPI: Annette Delntoinette E Thurmon is a 53 y.o. female  Chief Complaint  Patient presents with  . Anxiety    . This visit was completed via FaceTime due to the restrictions of the COVID-19 pandemic. All issues as above were discussed and addressed. Physical exam was done as above through visual confirmation on Facetime. If it was felt that the patient should be evaluated in the office, they were directed there. The patient verbally consented to this visit. . Location of the patient: home . Location of the provider: work . Those involved with this call:  . Provider: Aura DialsJolene Anothy Bufano, DNP . CMA: Wilhemena DurieBrittany Russell, CMA . Front Desk/Registration: Harriet PhoJoliza Johnson  . Time spent on call: 15 minutes with patient face to face via video conference. More than 50% of this time was spent in counseling and coordination of care. 10 minutes total spent in review of patient's record and preparation of their chart.  . I verified patient identity using two factors (patient name and date of birth). Patient consents verbally to being seen via telemedicine visit today.    ANXIETY/STRESS Currently takes Seroquel and Klonopin. Has refused to see psychiatry in past or utilize other medications, such as SSRI or Duloxetine (which would benefit both pain and mood). Pt isaware of risks of benzo medication use to include increased sedation, respiratory suppression, falls, extrapyramidal movements, dependence and cardiovascular events. Ptwould like to continue treatment as benefit determined to outweigh risk.Current Klonopin order is 0.5 MG BID as needed only, reports less stress since selling her house. She is taking it twice a day right now, had cut back but then some "things got messed up with her son".  She has verbalized interest at previous visits in tapering off Seroquel and Klonopin in  upcoming months.  Last visit discussed options like Duloxetine, SSRIs, or Trazodone with her. At this time she wishes to continue current therapy, but she reports she may try cutting back over next 3 months on her Klonopin prior to our next visit.  She is interested in coming off Klonopin slowly and trying Duloxetine or alternate medication.  Currently her [place] and her on in the [place].  Last Klonopin fill 10/08/18. Duration:controlled Anxious mood: yes Excessive worrying: no Irritability: no  Sweating: no Nausea: no Palpitations:no Hyperventilation: no Panic attacks: no Agoraphobia: no  Obscessions/compulsions: no Depressed mood: no [facility] 11/13/2018 08/10/2018 05/17/2018 05/17/2018 07/05/2017  Decreased Interest 0 0 3 3 0  Down, Depressed, Hopeless 0 0 3 3 0  PHQ - 2 Score 0 0 6 6 0  Altered sleeping 0 0 3 3 0  Tired, decreased energy 0 0 3 3 0  Change in appetite 0 - 3 3 0  Feeling bad or failure about yourself  0 0 0 0 0  Trouble concentrating 0 0 3 3 0  Moving slowly or fidgety/restless 0 0 3 3 0  Suicidal thoughts 0 0 0 0 0  PHQ-9 Score 0 0 21 21 0  Difficult doing work/chores Not difficult at all Not difficult at all - - -   Anhedonia: no Weight changes: no Insomnia: yes hard to stay asleep  Hypersomnia: no Fatigue/loss of energy: no Feelings of worthlessness: no Feelings of guilt: no Impaired concentration/indecisiveness: no Suicidal ideations: no  Crying spells: no Recent Stressors/Life  Changes: no   Relationship problems: no   Family stress: no     Financial stress: no    Job stress: no    Recent death/loss: no  GAD 7 : Generalized Anxiety Score 11/13/2018 08/10/2018 05/17/2018 02/15/2018  Nervous, Anxious, on Edge 0 0 3 3  Control/stop worrying 0 0 0 3  Worry too much - different things 0 0 0 3  Trouble relaxing 0 0 3 3  Restless 0 0 3 3  Easily annoyed or irritable 0 0 3 3  Afraid - awful might happen 0 0 0 3  Total GAD 7  Score 0 0 12 21  Anxiety Difficulty Not difficult at all Not difficult at all Very difficult Not difficult at all     Relevant past medical, surgical, family and social history reviewed and updated as indicated. Interim medical history since our last visit reviewed. Allergies and medications reviewed and updated.  Review of Systems  Constitutional: Negative for activity change, appetite change, diaphoresis, fatigue and fever.  Respiratory: Negative for cough, chest tightness and shortness of breath.   Cardiovascular: Negative for chest pain, palpitations and leg swelling.  Gastrointestinal: Negative for abdominal distention, abdominal pain, constipation, diarrhea, nausea and vomiting.  Neurological: Negative for dizziness, syncope, weakness, light-headedness, numbness and headaches.  Psychiatric/Behavioral: Positive for decreased concentration and sleep disturbance. Negative for self-injury and suicidal ideas. The patient is nervous/anxious.     Per HPI unless specifically indicated above     Objective:    LMP  (LMP Unknown)   Wt Readings from Last 3 Encounters:  10/26/18 110 lb (49.9 kg)  08/10/18 105 lb (47.6 kg)  06/29/18 100 lb (45.4 kg)    Physical Exam Vitals signs and nursing note reviewed.  Constitutional:      General: She is awake. She is not in acute distress.    Appearance: She is well-developed. She is not ill-appearing.  HENT:     Head: Normocephalic.     Right Ear: Hearing normal.     Left Ear: Hearing normal.  Eyes:     General: Lids are normal.        Right eye: No discharge.        Left eye: No discharge.     Conjunctiva/sclera: Conjunctivae normal.  Neck:     Musculoskeletal: Normal range of motion.  Pulmonary:     Effort: Pulmonary effort is normal. No accessory muscle usage or respiratory distress.  Neurological:     Mental Status: She is alert and oriented to person, place, and time.  Psychiatric:        Attention and Perception: Attention  normal.        Mood and Affect: Mood normal.        Behavior: Behavior normal. Behavior is cooperative.        Thought Content: Thought content normal.        Judgment: Judgment normal.     Results for orders placed or performed during the hospital encounter of 10/26/18  CBC with Differential  Result Value Ref Range   WBC 8.2 4.0 - 10.5 K/uL   RBC 4.10 3.87 - 5.11 MIL/uL   Hemoglobin 12.5 12.0 - 15.0 g/dL   HCT 36.5 36.0 - 46.0 %   MCV 89.0 80.0 - 100.0 fL   MCH 30.5 26.0 - 34.0 pg   MCHC 34.2 30.0 - 36.0 g/dL   RDW 12.2 11.5 - 15.5 %   Platelets 323 150 - 400 K/uL   nRBC 0.0  0.0 - 0.2 %   Neutrophils Relative % 65 %   Neutro Abs 5.4 1.7 - 7.7 K/uL   Lymphocytes Relative 25 %   Lymphs Abs 2.1 0.7 - 4.0 K/uL   Monocytes Relative 8 %   Monocytes Absolute 0.7 0.1 - 1.0 K/uL   Eosinophils Relative 1 %   Eosinophils Absolute 0.0 0.0 - 0.5 K/uL   Basophils Relative 1 %   Basophils Absolute 0.1 0.0 - 0.1 K/uL   Immature Granulocytes 0 %   Abs Immature Granulocytes 0.02 0.00 - 0.07 K/uL  Comprehensive metabolic panel  Result Value Ref Range   Sodium 138 135 - 145 mmol/L   Potassium 3.9 3.5 - 5.1 mmol/L   Chloride 106 98 - 111 mmol/L   CO2 23 22 - 32 mmol/L   Glucose, Bld 100 (H) 70 - 99 mg/dL   BUN 11 6 - 20 mg/dL   Creatinine, Ser 1.01 0.44 - 1.00 mg/dL   Calcium 75.1 8.9 - 02.5 mg/dL   Total Protein 8.0 6.5 - 8.1 g/dL   Albumin 4.8 3.5 - 5.0 g/dL   AST 21 15 - 41 U/L   ALT 16 0 - 44 U/L   Alkaline Phosphatase 83 38 - 126 U/L   Total Bilirubin 0.5 0.3 - 1.2 mg/dL   GFR calc non Af Amer >60 >60 mL/min   GFR calc Af Amer >60 >60 mL/min   Anion gap 9 5 - 15  Troponin I (High Sensitivity)  Result Value Ref Range   Troponin I (High Sensitivity) 3 <18 ng/L      Assessment & Plan:   Problem List Items Addressed This Visit      Other   Generalized anxiety disorder with panic attacks    Chronic, continues to verbalize interest in tapering off Seroquel and Klonopin, but  wishes to do this herself.  Continue current medication regimen at this time.  Denies SI/HI.  Refills sent.  Return in 3 months.         I discussed the assessment and treatment plan with the patient. The patient was provided an opportunity to ask questions and all were answered. The patient agreed with the plan and demonstrated an understanding of the instructions.   The patient was advised to call back or seek an in-person evaluation if the symptoms worsen or if the condition fails to improve as anticipated.   I provided 15 minutes of time during this encounter.  Follow up plan: Return in about 3 months (around 02/12/2019) for Anxiety .

## 2019-01-15 ENCOUNTER — Ambulatory Visit: Payer: Self-pay | Admitting: Nurse Practitioner

## 2019-02-06 ENCOUNTER — Other Ambulatory Visit: Payer: Self-pay | Admitting: Nurse Practitioner

## 2019-02-06 NOTE — Telephone Encounter (Signed)
Routing to provider  

## 2019-02-07 ENCOUNTER — Other Ambulatory Visit: Payer: Self-pay

## 2019-02-07 ENCOUNTER — Encounter: Payer: Self-pay | Admitting: Nurse Practitioner

## 2019-02-07 ENCOUNTER — Ambulatory Visit (INDEPENDENT_AMBULATORY_CARE_PROVIDER_SITE_OTHER): Payer: Self-pay | Admitting: Nurse Practitioner

## 2019-02-07 DIAGNOSIS — F410 Panic disorder [episodic paroxysmal anxiety] without agoraphobia: Secondary | ICD-10-CM

## 2019-02-07 DIAGNOSIS — F411 Generalized anxiety disorder: Secondary | ICD-10-CM

## 2019-02-07 MED ORDER — CLONAZEPAM 0.5 MG PO TABS: 0.5000 mg | ORAL_TABLET | Freq: Two times a day (BID) | ORAL | 2 refills | Status: DC | PRN

## 2019-02-07 NOTE — Assessment & Plan Note (Signed)
Chronic, continues to verbalize interest in tapering off Seroquel and Klonopin, but wishes to do this herself.  Continue current medication regimen at this time.  Denies SI/HI.  Refills sent.  She is to establish care with provider in [place] for further refills on this.

## 2019-02-07 NOTE — Patient Instructions (Signed)

## 2019-02-07 NOTE — Progress Notes (Signed)
LMP  (LMP Unknown)    Subjective:    Patient ID: Annette Atkins, female    DOB: [birth year], 53 y.o.   MRN: [id number]  HPI: Annette Atkins is a 53 y.o. female  Chief Complaint  Patient presents with  . Anxiety  . Sleeping Problem    pt states she has been able to go to sleep but states she cannot stay asleep    . This visit was completed via FaceTime due to the restrictions of the COVID-19 pandemic. All issues as above were discussed and addressed. Physical exam was done as above through visual confirmation on Facetime. If it was felt that the patient should be evaluated in the office, they were directed there. The patient verbally consented to this visit. . Location of the patient: home . Location of the provider: work . Those involved with this call:  . Provider: Aura Dials, DNP . CMA: Wilhemena Durie, CMA . Front Desk/Registration: Harriet Pho  . Time spent on call: 15 minutes with patient face to face via video conference. More than 50% of this time was spent in counseling and coordination of care. 10 minutes total spent in review of patient's record and preparation of their chart.  . I verified patient identity using two factors (patient name and date of birth). Patient consents verbally to being seen via telemedicine visit today.    ANXIETY/STRESS Currently takes Seroquel and Klonopin. Has refused to see psychiatry in past or utilize other medications, such as SSRI or Duloxetine (which would benefit both pain and mood). Pt isaware of risks of benzo medication use to include increased sedation, respiratory suppression, falls, extrapyramidal movements, dependence and cardiovascular events. Ptwould like to continue treatment as benefit determined to outweigh risk.  Current Klonopin is 0.5 MG BID as needed only, which she takes twice a day. Went completely vegan since September and has had no headaches since that time and decrease in chronic pain. Have  discussed options like Duloxetine, SSRIs, or Trazodone with her. At this time she wishes to continue current therapy, but she reports she may try cutting back over next 3 months on her Klonopin.  She is interested in coming off Klonopin slowly and trying Duloxetine or alternate medication.  Last Klonopin fill 01/09/19.  She recently moved to [place] and needs 3 months of refills and will transition to new provider.   Had oral surgery August 6th and did have Norco fills in #18 pills in July and August noted on [place] PMP review, discussed with her.  This was for acute pain only and has had no further fills. Duration:controlled Anxious mood: yes Excessive worrying: no Irritability: no  Sweating: no Nausea: no Palpitations:no Hyperventilation: no Panic attacks: no Agoraphobia: no  Obscessions/compulsions: no Depressed mood: no Depression screen [facility] 2/9 02/07/2019 11/13/2018 08/10/2018 05/17/2018 05/17/2018  Decreased Interest 0 0 0 3 3  Down, Depressed, Hopeless 0 0 0 3 3  PHQ - 2 Score 0 0 0 6 6  Altered sleeping 3 0 0 3 3  Tired, decreased energy 1 0 0 3 3  Change in appetite 0 0 - 3 3  Feeling bad or failure about yourself  0 0 0 0 0  Trouble concentrating 0 0 0 3 3  Moving slowly or fidgety/restless 0 0 0 3 3  Suicidal thoughts 0 0 0 0 0  PHQ-9 Score 4 0 0 21 21  Difficult doing work/chores Not difficult at all Not difficult at all Not difficult at all - -  Anhedonia: no Weight changes: no Insomnia: yes hard to stay asleep  Hypersomnia: no Fatigue/loss of energy: no Feelings of worthlessness: no Feelings of guilt: no Impaired concentration/indecisiveness: no Suicidal ideations: no  Crying spells: no Recent Stressors/Life Changes: no   Relationship problems: no   Family stress: no     Financial stress: no    Job stress: no    Recent death/loss: no  GAD 7 : Generalized Anxiety Score 02/07/2019 11/13/2018 08/10/2018 05/17/2018  Nervous, Anxious, on Edge 1 0 0 3  Control/stop  worrying 0 0 0 0  Worry too much - different things 0 0 0 0  Trouble relaxing 0 0 0 3  Restless 0 0 0 3  Easily annoyed or irritable 1 0 0 3  Afraid - awful might happen 0 0 0 0  Total GAD 7 Score 2 0 0 12  Anxiety Difficulty Not difficult at all Not difficult at all Not difficult at all Very difficult     Relevant past medical, surgical, family and social history reviewed and updated as indicated. Interim medical history since our last visit reviewed. Allergies and medications reviewed and updated.  Review of Systems  Constitutional: Negative for activity change, appetite change, diaphoresis, fatigue and fever.  Respiratory: Negative for cough, chest tightness and shortness of breath.   Cardiovascular: Negative for chest pain, palpitations and leg swelling.  Gastrointestinal: Negative for abdominal distention, abdominal pain, constipation, diarrhea, nausea and vomiting.  Neurological: Negative for dizziness, syncope, weakness, light-headedness, numbness and headaches.  Psychiatric/Behavioral: Positive for decreased concentration and sleep disturbance. Negative for self-injury and suicidal ideas. The patient is nervous/anxious.     Per HPI unless specifically indicated above     Objective:    LMP  (LMP Unknown)   Wt Readings from Last 3 Encounters:  10/26/18 110 lb (49.9 kg)  08/10/18 105 lb (47.6 kg)  06/29/18 100 lb (45.4 kg)    Physical Exam Vitals signs and nursing note reviewed.  Constitutional:      General: She is awake. She is not in acute distress.    Appearance: She is well-developed. She is not ill-appearing.  HENT:     Head: Normocephalic.     Right Ear: Hearing normal.     Left Ear: Hearing normal.  Eyes:     General: Lids are normal.        Right eye: No discharge.        Left eye: No discharge.     Conjunctiva/sclera: Conjunctivae normal.  Neck:     Musculoskeletal: Normal range of motion.  Pulmonary:     Effort: Pulmonary effort is normal. No  accessory muscle usage or respiratory distress.  Neurological:     Mental Status: She is alert and oriented to person, place, and time.  Psychiatric:        Attention and Perception: Attention normal.        Mood and Affect: Mood normal.        Behavior: Behavior normal. Behavior is cooperative.        Thought Content: Thought content normal.        Judgment: Judgment normal.    Results for orders placed or performed during the hospital encounter of 10/26/18  CBC with Differential  Result Value Ref Range   WBC 8.2 4.0 - 10.5 K/uL   RBC 4.10 3.87 - 5.11 MIL/uL   Hemoglobin 12.5 12.0 - 15.0 g/dL   HCT 36.5 36.0 - 46.0 %   MCV 89.0 80.0 - 100.0  fL   MCH 30.5 26.0 - 34.0 pg   MCHC 34.2 30.0 - 36.0 g/dL   RDW 40.912.2 81.111.5 - 91.415.5 %   Platelets 323 150 - 400 K/uL   nRBC 0.0 0.0 - 0.2 %   Neutrophils Relative % 65 %   Neutro Abs 5.4 1.7 - 7.7 K/uL   Lymphocytes Relative 25 %   Lymphs Abs 2.1 0.7 - 4.0 K/uL   Monocytes Relative 8 %   Monocytes Absolute 0.7 0.1 - 1.0 K/uL   Eosinophils Relative 1 %   Eosinophils Absolute 0.0 0.0 - 0.5 K/uL   Basophils Relative 1 %   Basophils Absolute 0.1 0.0 - 0.1 K/uL   Immature Granulocytes 0 %   Abs Immature Granulocytes 0.02 0.00 - 0.07 K/uL  Comprehensive metabolic panel  Result Value Ref Range   Sodium 138 135 - 145 mmol/L   Potassium 3.9 3.5 - 5.1 mmol/L   Chloride 106 98 - 111 mmol/L   CO2 23 22 - 32 mmol/L   Glucose, Bld 100 (H) 70 - 99 mg/dL   BUN 11 6 - 20 mg/dL   Creatinine, Ser 7.820.93 0.44 - 1.00 mg/dL   Calcium 95.610.0 8.9 - 21.310.3 mg/dL   Total Protein 8.0 6.5 - 8.1 g/dL   Albumin 4.8 3.5 - 5.0 g/dL   AST 21 15 - 41 U/L   ALT 16 0 - 44 U/L   Alkaline Phosphatase 83 38 - 126 U/L   Total Bilirubin 0.5 0.3 - 1.2 mg/dL   GFR calc non Af Amer >60 >60 mL/min   GFR calc Af Amer >60 >60 mL/min   Anion gap 9 5 - 15  Troponin I (High Sensitivity)  Result Value Ref Range   Troponin I (High Sensitivity) 3 <18 ng/L      Assessment & Plan:    Problem List Items Addressed This Visit      Other   Generalized anxiety disorder with panic attacks    Chronic, continues to verbalize interest in tapering off Seroquel and Klonopin, but wishes to do this herself.  Continue current medication regimen at this time.  Denies SI/HI.  Refills sent.  She is to establish care with provider in [place] for further refills on this.         I discussed the assessment and treatment plan with the patient. The patient was provided an opportunity to ask questions and all were answered. The patient agreed with the plan and demonstrated an understanding of the instructions.   The patient was advised to call back or seek an in-person evaluation if the symptoms worsen or if the condition fails to improve as anticipated.   I provided 15 minutes of time during this encounter.  Follow up plan: Return for to establish care with provider in [place].

## 2019-02-14 ENCOUNTER — Ambulatory Visit: Payer: Self-pay | Admitting: Nurse Practitioner

## 2019-05-10 ENCOUNTER — Other Ambulatory Visit: Payer: Self-pay | Admitting: Nurse Practitioner

## 2019-05-10 NOTE — Telephone Encounter (Signed)
Pt still has not found a Dr in her network when she now lives and wanted to know if Jolene can refill her clonazePAM (KLONOPIN) 0.5 MG tablet  For one month/ please advise

## 2019-05-10 NOTE — Telephone Encounter (Signed)
Patient last seen 02/07/19

## 2019-06-15 ENCOUNTER — Telehealth: Payer: Self-pay | Admitting: Nurse Practitioner

## 2019-06-15 ENCOUNTER — Other Ambulatory Visit: Payer: Self-pay | Admitting: Nurse Practitioner

## 2019-06-15 NOTE — Telephone Encounter (Signed)
Copied from CRM [id number]. Topic: General - Other >> Jun 15, 2019 10:54 AM Angela Nevin wrote: Patient requesting to speak with Jolene, today, if possible. I advised patient that today is half day for the office. Patient adamant about speaking with Jolene. When questioned, patient stated it was about needing a medication refill. I sent refill request to [facility] but patient still states she needs to speak with PCP regarding.

## 2019-06-15 NOTE — Telephone Encounter (Signed)
clonazePAM (KLONOPIN) 0.5 MG tablet    Patient is requesting a refill of this medication. Patient states that she is out of this medication.    Pharmacy:  [facility] [address], [place] - [address] Phone:  [phone]  Fax:  [phone]

## 2019-06-15 NOTE — Telephone Encounter (Signed)
Spoke to patient on telephone as she is wanting refill on Klonopin.  She has been living in [place] now for several months and did establish care with an NP there, but reports she does not like her and she would not fill any medications until they had records.  She also reports she had dizziness and was sent to and ENT there for that.  This PCP reviewed PMP database and noted a fill of #60 tablets Diazepam 2 MG from Dr. Verne Spurr on 06/08/2019, this is a 60 day supply.  Patient reports she was give that for her dizziness and she had told them she was taking Klonopin. Informed patient that this PCP could not refill her Klonopin at this time due to this fill being noted, recommend she use current Diazepam supply provided and ensure she establish care with provider in [place] for further refills on medications.  She stated understanding and appreciation for call.

## 2019-06-15 NOTE — Telephone Encounter (Signed)
Copied from CRM #[id number]. Topic: General - Other >> Jun 15, 2019 10:54 AM Williams, Candice N wrote: Patient requesting to speak with Jolene, today, if possible. I advised patient that today is half day for the office. Patient adamant about speaking with Jolene. When questioned, patient stated it was about needing a medication refill. I sent refill request to [facility] but patient still states she needs to speak with PCP regarding.
